# Patient Record
Sex: Male | Born: 1950 | Race: White | Hispanic: No | Marital: Married | Smoking: Former smoker
Health system: Southern US, Community
[De-identification: ages and names within clinical notes are randomized; demographics above are authoritative.]

## PROBLEM LIST (undated history)

## (undated) DIAGNOSIS — L0292 Furuncle, unspecified: Secondary | ICD-10-CM

## (undated) DIAGNOSIS — I77811 Abdominal aortic ectasia: Secondary | ICD-10-CM

## (undated) DIAGNOSIS — Z8719 Personal history of other diseases of the digestive system: Secondary | ICD-10-CM

## (undated) DIAGNOSIS — T4145XA Adverse effect of unspecified anesthetic, initial encounter: Secondary | ICD-10-CM

## (undated) DIAGNOSIS — Z9889 Other specified postprocedural states: Secondary | ICD-10-CM

## (undated) DIAGNOSIS — F419 Anxiety disorder, unspecified: Secondary | ICD-10-CM

## (undated) DIAGNOSIS — L0293 Carbuncle, unspecified: Secondary | ICD-10-CM

## (undated) DIAGNOSIS — K219 Gastro-esophageal reflux disease without esophagitis: Secondary | ICD-10-CM

## (undated) DIAGNOSIS — J40 Bronchitis, not specified as acute or chronic: Secondary | ICD-10-CM

## (undated) DIAGNOSIS — K589 Irritable bowel syndrome without diarrhea: Secondary | ICD-10-CM

## (undated) DIAGNOSIS — R112 Nausea with vomiting, unspecified: Secondary | ICD-10-CM

## (undated) DIAGNOSIS — T7840XA Allergy, unspecified, initial encounter: Secondary | ICD-10-CM

## (undated) DIAGNOSIS — E041 Nontoxic single thyroid nodule: Secondary | ICD-10-CM

## (undated) DIAGNOSIS — J45909 Unspecified asthma, uncomplicated: Secondary | ICD-10-CM

## (undated) DIAGNOSIS — T8859XA Other complications of anesthesia, initial encounter: Secondary | ICD-10-CM

## (undated) DIAGNOSIS — M1990 Unspecified osteoarthritis, unspecified site: Secondary | ICD-10-CM

## (undated) DIAGNOSIS — I10 Essential (primary) hypertension: Secondary | ICD-10-CM

## (undated) HISTORY — PX: OTHER SURGICAL HISTORY: SHX169

## (undated) HISTORY — DX: Gastro-esophageal reflux disease without esophagitis: K21.9

## (undated) HISTORY — DX: Furuncle, unspecified: L02.92

## (undated) HISTORY — DX: Abdominal aortic ectasia: I77.811

## (undated) HISTORY — DX: Personal history of other diseases of the digestive system: Z87.19

## (undated) HISTORY — DX: Personal history of other diseases of the digestive system: Z98.890

## (undated) HISTORY — DX: Bronchitis, not specified as acute or chronic: J40

## (undated) HISTORY — DX: Allergy, unspecified, initial encounter: T78.40XA

## (undated) HISTORY — DX: Unspecified asthma, uncomplicated: J45.909

## (undated) HISTORY — DX: Irritable bowel syndrome, unspecified: K58.9

## (undated) HISTORY — DX: Carbuncle, unspecified: L02.93

---

## 1994-04-17 HISTORY — PX: LAPAROSCOPIC CHOLECYSTECTOMY: SUR755

## 1998-12-17 ENCOUNTER — Encounter: Payer: Self-pay | Admitting: Emergency Medicine

## 1998-12-17 ENCOUNTER — Emergency Department (HOSPITAL_COMMUNITY): Admission: EM | Admit: 1998-12-17 | Discharge: 1998-12-17 | Payer: Self-pay | Admitting: Emergency Medicine

## 2005-10-05 ENCOUNTER — Ambulatory Visit: Payer: Self-pay | Admitting: Pulmonary Disease

## 2005-10-24 ENCOUNTER — Ambulatory Visit: Payer: Self-pay | Admitting: Pulmonary Disease

## 2005-12-16 HISTORY — PX: OTHER SURGICAL HISTORY: SHX169

## 2006-01-05 ENCOUNTER — Ambulatory Visit (HOSPITAL_BASED_OUTPATIENT_CLINIC_OR_DEPARTMENT_OTHER): Admission: RE | Admit: 2006-01-05 | Discharge: 2006-01-05 | Payer: Self-pay | Admitting: General Surgery

## 2007-11-20 ENCOUNTER — Encounter: Payer: Self-pay | Admitting: Pulmonary Disease

## 2007-12-25 ENCOUNTER — Encounter: Payer: Self-pay | Admitting: Pulmonary Disease

## 2009-01-14 ENCOUNTER — Telehealth: Payer: Self-pay | Admitting: Internal Medicine

## 2009-01-14 ENCOUNTER — Telehealth: Payer: Self-pay | Admitting: Pulmonary Disease

## 2009-02-22 ENCOUNTER — Telehealth (INDEPENDENT_AMBULATORY_CARE_PROVIDER_SITE_OTHER): Payer: Self-pay | Admitting: *Deleted

## 2009-02-24 ENCOUNTER — Ambulatory Visit: Payer: Self-pay | Admitting: Pulmonary Disease

## 2009-03-01 ENCOUNTER — Ambulatory Visit: Payer: Self-pay | Admitting: Pulmonary Disease

## 2009-03-01 DIAGNOSIS — K589 Irritable bowel syndrome without diarrhea: Secondary | ICD-10-CM

## 2009-03-01 DIAGNOSIS — K219 Gastro-esophageal reflux disease without esophagitis: Secondary | ICD-10-CM

## 2009-03-14 DIAGNOSIS — R0789 Other chest pain: Secondary | ICD-10-CM | POA: Insufficient documentation

## 2009-03-14 DIAGNOSIS — K4090 Unilateral inguinal hernia, without obstruction or gangrene, not specified as recurrent: Secondary | ICD-10-CM | POA: Insufficient documentation

## 2009-03-14 LAB — CONVERTED CEMR LAB
ALT: 16 units/L (ref 0–53)
AST: 21 units/L (ref 0–37)
Albumin: 4.4 g/dL (ref 3.5–5.2)
Alkaline Phosphatase: 57 units/L (ref 39–117)
BUN: 13 mg/dL (ref 6–23)
Basophils Absolute: 0 10*3/uL (ref 0.0–0.1)
Basophils Relative: 0.4 % (ref 0.0–3.0)
Bilirubin Urine: NEGATIVE
Bilirubin, Direct: 0.2 mg/dL (ref 0.0–0.3)
CO2: 30 meq/L (ref 19–32)
Calcium: 9.2 mg/dL (ref 8.4–10.5)
Chloride: 101 meq/L (ref 96–112)
Cholesterol: 157 mg/dL (ref 0–200)
Creatinine, Ser: 1.3 mg/dL (ref 0.4–1.5)
Eosinophils Absolute: 0.2 10*3/uL (ref 0.0–0.7)
Eosinophils Relative: 3.4 % (ref 0.0–5.0)
GFR calc non Af Amer: 60.56 mL/min (ref >60)
Glucose, Bld: 91 mg/dL (ref 70–99)
HCT: 48 % (ref 39.0–52.0)
HDL: 38.5 mg/dL — ABNORMAL LOW (ref >39.00)
Hemoglobin: 16.2 g/dL (ref 13.0–17.0)
Ketones, ur: NEGATIVE mg/dL
LDL Cholesterol: 92 mg/dL (ref 0–99)
Leukocytes, UA: NEGATIVE
Lymphocytes Relative: 19.8 % (ref 12.0–46.0)
Lymphs Abs: 1.3 10*3/uL (ref 0.7–4.0)
MCHC: 33.8 g/dL (ref 30.0–36.0)
MCV: 95.2 fL (ref 78.0–100.0)
Monocytes Absolute: 0.6 10*3/uL (ref 0.1–1.0)
Monocytes Relative: 9.6 % (ref 3.0–12.0)
Neutro Abs: 4.3 10*3/uL (ref 1.4–7.7)
Neutrophils Relative %: 66.8 % (ref 43.0–77.0)
Nitrite: NEGATIVE
PSA: 0.82 ng/mL (ref 0.10–4.00)
Platelets: 207 10*3/uL (ref 150.0–400.0)
Potassium: 4.1 meq/L (ref 3.5–5.1)
RBC: 5.04 M/uL (ref 4.22–5.81)
RDW: 12.5 % (ref 11.5–14.6)
Sodium: 139 meq/L (ref 135–145)
Specific Gravity, Urine: <=1.005 (ref 1.000–1.030)
TSH: 1.39 microintl units/mL (ref 0.35–5.50)
Total Bilirubin: 1.1 mg/dL (ref 0.3–1.2)
Total CHOL/HDL Ratio: 4
Total Protein, Urine: NEGATIVE mg/dL
Total Protein: 6.7 g/dL (ref 6.0–8.3)
Triglycerides: 132 mg/dL (ref 0.0–149.0)
Urine Glucose: NEGATIVE mg/dL
Urobilinogen, UA: 0.2 (ref 0.0–1.0)
VLDL: 26.4 mg/dL (ref 0.0–40.0)
WBC: 6.4 10*3/uL (ref 4.5–10.5)
pH: 5.5 (ref 5.0–8.0)

## 2009-10-29 ENCOUNTER — Ambulatory Visit: Payer: Self-pay | Admitting: Internal Medicine

## 2009-10-29 ENCOUNTER — Encounter: Payer: Self-pay | Admitting: Adult Health

## 2009-11-09 ENCOUNTER — Ambulatory Visit: Payer: Self-pay | Admitting: Pulmonary Disease

## 2010-01-12 ENCOUNTER — Telehealth (INDEPENDENT_AMBULATORY_CARE_PROVIDER_SITE_OTHER): Payer: Self-pay | Admitting: *Deleted

## 2010-03-30 ENCOUNTER — Ambulatory Visit: Payer: Self-pay | Admitting: Pulmonary Disease

## 2010-03-30 ENCOUNTER — Encounter: Payer: Self-pay | Admitting: Pulmonary Disease

## 2010-04-04 ENCOUNTER — Ambulatory Visit: Payer: Self-pay | Admitting: Pulmonary Disease

## 2010-04-13 LAB — CONVERTED CEMR LAB
ALT: 17 units/L (ref 0–53)
AST: 19 units/L (ref 0–37)
Albumin: 4.3 g/dL (ref 3.5–5.2)
Alkaline Phosphatase: 59 units/L (ref 39–117)
BUN: 19 mg/dL (ref 6–23)
Basophils Absolute: 0.1 10*3/uL (ref 0.0–0.1)
Basophils Relative: 0.8 % (ref 0.0–3.0)
Bilirubin, Direct: 0.1 mg/dL (ref 0.0–0.3)
CO2: 30 meq/L (ref 19–32)
Calcium: 9.2 mg/dL (ref 8.4–10.5)
Chloride: 102 meq/L (ref 96–112)
Cholesterol: 180 mg/dL (ref 0–200)
Creatinine, Ser: 1.4 mg/dL (ref 0.4–1.5)
Eosinophils Absolute: 0.6 10*3/uL (ref 0.0–0.7)
Eosinophils Relative: 8 % — ABNORMAL HIGH (ref 0.0–5.0)
GFR calc non Af Amer: 57.75 mL/min — ABNORMAL LOW (ref >60.00)
Glucose, Bld: 72 mg/dL (ref 70–99)
HCT: 47.3 % (ref 39.0–52.0)
HDL: 42.5 mg/dL (ref >39.00)
Hemoglobin: 16.4 g/dL (ref 13.0–17.0)
LDL Cholesterol: 122 mg/dL — ABNORMAL HIGH (ref 0–99)
Lymphocytes Relative: 26.4 % (ref 12.0–46.0)
Lymphs Abs: 1.9 10*3/uL (ref 0.7–4.0)
MCHC: 34.7 g/dL (ref 30.0–36.0)
MCV: 91 fL (ref 78.0–100.0)
Monocytes Absolute: 0.6 10*3/uL (ref 0.1–1.0)
Monocytes Relative: 8.7 % (ref 3.0–12.0)
Neutro Abs: 4 10*3/uL (ref 1.4–7.7)
Neutrophils Relative %: 56.1 % (ref 43.0–77.0)
PSA: 1.41 ng/mL (ref 0.10–4.00)
Platelets: 236 10*3/uL (ref 150.0–400.0)
Potassium: 4.1 meq/L (ref 3.5–5.1)
RBC: 5.19 M/uL (ref 4.22–5.81)
RDW: 13 % (ref 11.5–14.6)
Sodium: 141 meq/L (ref 135–145)
TSH: 1.46 microintl units/mL (ref 0.35–5.50)
Total Bilirubin: 1.2 mg/dL (ref 0.3–1.2)
Total CHOL/HDL Ratio: 4
Total Protein: 6.8 g/dL (ref 6.0–8.3)
Triglycerides: 78 mg/dL (ref 0.0–149.0)
VLDL: 15.6 mg/dL (ref 0.0–40.0)
WBC: 7.2 10*3/uL (ref 4.5–10.5)

## 2010-05-08 ENCOUNTER — Encounter: Payer: Self-pay | Admitting: Neurosurgery

## 2010-05-19 NOTE — Assessment & Plan Note (Signed)
Summary: physical ///kp   CC:  Yearly ROV & CPX....  History of Present Illness: 60 y/o WM here for a follow up visit and CPX...  he saw TP 7/11 w/ "boils" and cultures grew MSSA- he was treated topically & w/ Keflex, then Avelox, & it resolved w/o recurrence so far... recently he's noted some incr SOB- w/ cold ait & paint fumes; treated self w/ OTC Primateen like inhaler & improved... recall hx AB in past, ex-smoker quit >45yrs, & we discussed RADS etc... see CXR, PFTs below & we decided to treat w/ PROAIT HFA Prn for now & monitor him...    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 presented 12/11 w/ several week hx incr SOB, min cough, no sputum, denies f/c/s, no sinus or upper resp symptoms... he treated self w/ OTC Primateen like med & improved... we decided to Rx w/ PROAIR Prn for now...  ~  CXR 12/11 showed   ~  PFT 12/11 showed FVC= 4.4 (84%), FEV1= 3.4 (85%), %1sec= 77, mid-flows= 91%pred, O2sat=99%.   Hx of CHEST PAIN, ATYPICAL (ICD-786.59) - on ASA 81mg /d... he had Cardiolite 2000 which was neg...  ~  EKG 12/11 showed NSR, WNL, NAD... denies CP, angina, palpit, etc...  GERD (ICD-530.81) - on PRILOSEC 20mg /d... notes some heartburn, no dysphagia etc...  IRRITABLE BOWEL SYNDROME (ICD-564.1) - occas abd discomfort & alt diarrhea/ constip... had colonoscopy 2005 which was neg x hemorrhoids... f/u planned 97yrs.  Hx of INGUINAL HERNIA (ICD-550.90) - he had left inguinal hernia repaired at age 61, and a right inguinal hernia repair w/ mesh in 2007... he had post-op pain, neuritis that improved w/ Lyrica, now resolved.  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 advised nasal saline spray.   Preventive Screening-Counseling & Management  Alcohol-Tobacco     Smoking Status: quit     Year Quit: 1985  Allergies (verified): No Known  Drug Allergies  Comments:  Nurse/Medical Assistant: The patient's medications and allergies were reviewed with the patient and were updated in the Medication and Allergy Lists.  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 mother deceased age 1 from breast cancer father alive age 64  hx of thyroid cancer 1 sibling alive age 30 1 sibling alive age 28  Social History: Reviewed history from 03/01/2009 and no changes required. divorced 3 children quit smoking 1985 exposed to second hand smoke exercises 3 times per week caffeine use--6 cups per day alcohol use 1-2 daily  Review of Systems  The patient denies fever, chills, sweats, anorexia, fatigue, weakness, malaise, weight loss, sleep disorder, blurring, diplopia, eye irritation, eye discharge, vision loss, eye pain, photophobia, earache, ear discharge, tinnitus, decreased hearing, nasal congestion, nosebleeds, sore throat, hoarseness, chest pain, palpitations, syncope, dyspnea on exertion, orthopnea, PND, peripheral edema, cough, dyspnea at rest, excessive sputum, hemoptysis, wheezing, pleurisy, nausea, vomiting, diarrhea, constipation, change in bowel habits, abdominal pain, melena, hematochezia, jaundice, gas/bloating, indigestion/heartburn, dysphagia, odynophagia, dysuria, hematuria, urinary frequency, urinary hesitancy, nocturia, incontinence, back pain,  joint pain, joint swelling, muscle cramps, muscle weakness, stiffness, arthritis, sciatica, restless legs, leg pain at night, leg pain with exertion, rash,  itching, dryness, suspicious lesions, paralysis, paresthesias, seizures, tremors, vertigo, transient blindness, frequent falls, frequent headaches, difficulty walking, depression, anxiety, memory loss, confusion, cold intolerance, heat intolerance, polydipsia, polyphagia, polyuria, unusual weight change, abnormal bruising, bleeding, enlarged lymph nodes, urticaria, allergic rash, hay fever, and recurrent infections.    Vital Signs:  Patient profile:   60 year old male Height:      72 inches Weight:      183.13 pounds BMI:     24.93 O2 Sat:      100 % on Room air Temp:     97.2 degrees F oral Pulse rate:   64 / minute BP sitting:   148 / 88  (left arm) Cuff size:   regular  Vitals Entered By: Randell Loop CMA (March 30, 2010 12:01 PM)  O2 Sat at Rest %:  100 O2 Flow:  Room air CC: Yearly ROV & CPX... Is Patient Diabetic? No Pain Assessment Patient in pain? no      Comments MEDS UPDATED TODAY WITH PT   Physical Exam  Additional Exam:  WD, WN, 60 y/o WM in NAD... GENERAL:  Alert & oriented; pleasant & cooperative. HEENT:  [place]/AT, EOM-wnl, PERRLA, Fundi-benign, EACs-clear, TMs-wnl, NOSE-clear, THROAT-clear & wnl. NECK:  Supple w/ full ROM; no JVD; normal carotid impulses w/o bruits; no thyromegaly or nodules palpated; no lymphadenopathy. CHEST:  Clear to P & A; without wheezes/ rales/ or rhonchi. HEART:  Regular Rhythm; without murmurs/ rubs/ or gallops. ABDOMEN:  Soft & nontender; normal bowel sounds; no organomegaly or masses detected. RECTAL:  Neg - prostate 2+ & nontender w/o nodules; stool hematest neg. EXT: without deformities or arthritic changes; no varicose veins/ venous insuffic/ or edema. NEURO:  CN's intact; motor testing normal; sensory testing normal; gait normal & balance OK. DERM:  No lesions noted; no rash etc...    CXR  Procedure date:  03/30/2010  Findings:      CHEST - 2 VIEW Comparison: 03/01/2009   Findings: Heart and mediastinal contours are  within normal limits. No focal opacities or effusions.  No acute bony abnormality.   IMPRESSION: No active disease.   MISC. Report  Procedure date:  03/30/2010  Findings:      Pt will return for FASTING labs & we will call him w/ results... SN   Pulmonary Function Test Date: 03/30/2010 Height (in.): 72 Gender: Male  Pre-Spirometry FVC    Value: 4.4 L/min   Pred: 5.2 L/min     % Pred: 84 % FEV1    Value: 3.4 L     Pred: 4.0 L     % Pred: 85 % FEV1/FVC  Value: 77 %     Pred: 76 %     % Pred: -- % FEF 25-75  Value: 3.0 L/min   Pred: 3.3 L/min     % Pred: 91 %  Comments: Normalo airflow, volumes at lower end of normal... 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 we discussed checking PFTs> they look good, no signif obstructive dis, & we decided to Rx w/ PROAIR Prn... also discussed need for controller drug IF he's having to use the Proair regularly... Orders: Spirometry w/Graph (94010)  Problem # 3:  Hx of CHEST PAIN, ATYPICAL (ICD-786.59) No CP, palpit, etc...  Problem # 4:  GERD (ICD-530.81) Stable on the PPI Rx Prn... notes occais reflux, otherw neg... His updated medication list for this problem includes:    Prilosec Otc 20 Mg Tbec (Omeprazole magnesium) .Marland Kitchen... Take 1 tablet by mouth once a day  Problem # 5:  IRRITABLE BOWEL SYNDROME (ICD-564.1) He isn't due for f/u colonoscopy til 2015...  Problem # 6:  BOILS,  RECURRENT (ICD-680.9) No recurrence so far> asked to use saline on nares, & call for any problems...  Complete Medication List: 1)  Aspirin 81 Mg Tabs (Aspirin) .... Take 1 tablet by mouth once a day 2)  Proair Hfa 108 (90 Base) Mcg/act Aers (Albuterol sulfate) .... 2 sprays up to every 4 h as needed for wheezing... 3)  Prilosec Otc 20 Mg Tbec (Omeprazole magnesium) .... Take 1 tablet by mouth once a day  Patient Instructions: 1)  Today we updated your med list- see below.... 2)  We wrote a new perscription for PROAIR (a rescue inhaler) to use as we discussed... 3)  Call for any persistant problem w/ your breathing... 4)  Today we did a follow up CXR, EKG, & FASTING blood work... please call the "phone tree" in a few days for your lab results.Marland KitchenMarland Kitchen  5)  Keep up the good work w/ diet + exercise... 6)  Please schedule a follow-up appointment in 1 year, sooner as needed. Prescriptions: PROAIR HFA 108 (90 BASE) MCG/ACT AERS (ALBUTEROL SULFATE) 2 sprays up to every 4 H as needed for wheezing...  #1 x prn   Entered and Authorized by:   Michele Mcalpine MD   Signed by:   Michele Mcalpine MD on 03/30/2010   Method used:   Print then Give to Patient   RxID:   [id number]    Immunization History:  Influenza Immunization History:    Influenza:  historical (01/24/2010)

## 2010-05-19 NOTE — Assessment & Plan Note (Signed)
Summary: NP follow up - boil, left arm   CC:  1 week follow boil on left forearm - has improved since last OV.  History of Present Illness: 60 y/o WM  with known hx of GERD   October 29, 2009--Presents for recurrent boils x77months, states that the boil on the left forearm is about the 6th one he's had - red, raised, painful with occasional drainage x5days. Has noticed over last 6 months recurrent boils under arms on chest and arms. No new meds, travel , actiivties. Denies chest pain, dyspnea, orthopnea, hemoptysis, fever, n/v/d, edema, headache.   11/09/09--Returns for 1 week follow boil on left forearm - has improved since last OV. Wound cx showed staph aureus which was pan sensitive except for PCN. He was tx w/ Avelox. Boil has decreased in size, almost gone. Redness is decreased. Denies chest pain, dyspnea, orthopnea, hemoptysis, fever, n/v/d, edema, headache.   Medications Prior to Update: 1)  Aspirin 81 Mg Tabs (Aspirin) .... Take 1 Tablet By Mouth Once A Day 2)  Prilosec Otc 20 Mg Tbec (Omeprazole Magnesium) .... Take 1 Tablet By Mouth Once A Day 3)  Avelox 400 Mg Tabs (Moxifloxacin Hcl) .... Take 1 Tablet By Mouth Once A Day X7days  Current Medications (verified): 1)  Aspirin 81 Mg Tabs (Aspirin) .... Take 1 Tablet By Mouth Once A Day 2)  Prilosec Otc 20 Mg Tbec (Omeprazole Magnesium) .... 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 no recent bronchitic problems...  Hx of CHEST PAIN, ATYPICAL (ICD-786.59) - on ASA 81mg /d... he had Cardiolite 2000 which was neg...  GERD (ICD-530.81) - on PRILOSEC 20mg /d...  IRRITABLE BOWEL SYNDROME (ICD-564.1) - occas abd discomfort & alt diarrhea/ constip... had colonoscopy 2005 which was neg x hemorrhoids... f/u planned 24yrs.  Hx of INGUINAL HERNIA (ICD-550.90) -  he had left inguinal hernia repaired at age 23, and a right inguinal hernia repair w/ mesh in 1996... he had post-op pain, neuritis that improved w/ Lyrica...  Past Surgical History: Last updated: 2009-03-19 S/P left inguinal hernia repair at age 75 S/P lap cholecystectomy 1996 S/P right inguinal hernia repair w/ mesh 9/07 DrThompson  Family History: Last updated: 19-Mar-2009 mother deceased age 46 from breast cancer father alive age 70  hx of thyroid cancer 1 sibling alive age 48 1 sibling alive age 23  Social History: Last updated: 2009-03-19 divorced 3 children quit smoking 1985 exposed to second hand smoke exercises 3 times per week caffeine use--6 cups per day alcohol use 1-2 daily  Risk Factors: Smoking Status: quit (10/29/2009)  Review of Systems      See HPI  Vital Signs:  Patient profile:   60 year old male Height:      72 inches Weight:      186 pounds BMI:     25.32 O2 Sat:      99 % on Room air Temp:     98.0 degrees F oral Pulse rate:   66 / minute BP sitting:   132 / 84  (right arm) Cuff size:   regular  Vitals Entered By: Boone Master CNA/MA (November 09, 2009 4:09 PM)  O2 Flow:  Room air CC: 1 week follow boil on left forearm - has improved since last OV Is Patient Diabetic? No Comments Medications reviewed with patient Daytime contact number  verified with patient. Boone Master CNA/MA  November 09, 2009 4:10 PM    Physical Exam  Additional Exam:  WD, WN, 60 y/o WM in NAD... GENERAL:  Alert & oriented; pleasant & cooperative.  DERM:  left mid forearm decreased size of carbuncle-decresed redness. much improved.     Impression & Recommendations:  Problem # 1:  BOILS, RECURRENT (ICD-680.9)  Improved.  if returns may need derm referral REC:  Clean area two times a day w/ soap and water, pat dry.  Warm compresses three times a day until resolved.  Please contact office for sooner follow up if symptoms do not improve or worsen   Orders: Est.  Patient Level II (14782)  Complete Medication List: 1)  Aspirin 81 Mg Tabs (Aspirin) .... Take 1 tablet by mouth once a day 2)  Prilosec Otc 20 Mg Tbec (Omeprazole magnesium) .... Take 1 tablet by mouth once a day  Patient Instructions: 1)  Clean area two times a day w/ soap and water, pat dry.  2)  Warm compresses three times a day until resolved.  3)  Please contact office for sooner follow up if symptoms do not improve or worsen

## 2010-05-19 NOTE — Progress Notes (Signed)
Summary: possible pink eye  Phone Note Call from Patient Call back at Home Phone [phone]   Caller: Patient Call For: nadel Summary of Call: Pt stated he saw TP in July for staph infection, he suspects he has a reoccurence, pls advise.//[facility] Kathryne Sharper Initial call taken by: Darletta Moll,  January 12, 2010 1:02 PM  Follow-up for Phone Call        called spoke with patient who states the previous boil on the left arm resolved.  states has possible pink eye in right eye that is red, itchy and has drainage x3days.  has improved, but not completely resolved.  d/t the high contagion of pink eye, does pt need ov?  please advise, thanks!  Merilynn [place].  NKDA.  Follow-up by: Boone Master CNA/MA,  January 12, 2010 3:15 PM  Additional Follow-up for Phone Call Additional follow up Details #1::        per SN----neosporin  optic  #1 vial  1-2 gtts in affected eye four times daily as directed.  with no refills---this has already been sent to the pharmacy thanks  Randell Loop [facility]  January 13, 2010 8:55 AM     Additional Follow-up for Phone Call Additional follow up Details #2::    Spoke with pt and notified med was sent to pharmacy.  Pt verbalized understanding. Follow-up by: Vernie Murders,  January 13, 2010 9:07 AM  New/Updated Medications: NEOSPORIN  SOLN (NEOMYCIN-POLYMYX-GRAMICID) 1-2 gtts in affected eye four times daily as directed Prescriptions: NEOSPORIN  SOLN (NEOMYCIN-POLYMYX-GRAMICID) 1-2 gtts in affected eye four times daily as directed  #1 vial x 0   Entered by:   Randell Loop CMA   Authorized by:   Michele Mcalpine MD   Signed by:   Vernie Murders on 01/13/2010   Method used:   Electronically to        [facility] (retail)       [address] [place], [place]  [address]       Ph: [phone]       Fax: [phone]   RxID:   [id number]

## 2010-05-19 NOTE — Assessment & Plan Note (Signed)
Summary: Acute NP office visit - boil left forearm   CC:  recurrent boils x53months, states that the boil on the left forearm is about the 6th one he's had - red, raised, and painful with occasional drainage x5days.  History of Present Illness: 60 y/o WM  with known hx of GERD   October 29, 2009--Presents for recurrent boils x79months, states that the boil on the left forearm is about the 6th one he's had - red, raised, painful with occasional drainage x5days. Has noticed over last 6 months recurrent boils under arms on chest and arms. No new meds, travel , actiivties. Denies chest pain, dyspnea, orthopnea, hemoptysis, fever, n/v/d, edema, headache.   Preventive Screening-Counseling & Management  Alcohol-Tobacco     Smoking Status: quit  Medications Prior to Update: 1)  Aspirin 81 Mg Tabs (Aspirin) .... Take 1 Tablet By Mouth Once A Day 2)  Prilosec Otc 20 Mg Tbec (Omeprazole Magnesium) .... Take 1 Tablet By Mouth Once A Day  Current Medications (verified): 1)  Aspirin 81 Mg Tabs (Aspirin) .... Take 1 Tablet By Mouth Once A Day 2)  Prilosec Otc 20 Mg Tbec (Omeprazole Magnesium) .... Take 1 Tablet By Mouth Once A Day  Allergies (verified): No Known Drug Allergies  Past History:  Past Surgical History: Last updated: March 03, 2009 S/P left inguinal hernia repair at age 83 S/P lap cholecystectomy 1996 S/P right inguinal hernia repair w/ mesh 9/07 DrThompson  Family History: Last updated: Mar 03, 2009 mother deceased age 31 from breast cancer father alive age 73  hx of thyroid cancer 1 sibling alive age 10 1 sibling alive age 41  Social History: Last updated: 03/03/09 divorced 3 children quit smoking 1985 exposed to second hand smoke exercises 3 times per week caffeine use--6 cups per day alcohol use 1-2 daily  Risk Factors: Smoking Status: quit (10/29/2009)  Past Medical History: Hx of BRONCHITIS, RECURRENT (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 no recent  bronchitic problems...  Hx of CHEST PAIN, ATYPICAL (ICD-786.59) - on ASA 81mg /d... he had Cardiolite 2000 which was neg...  GERD (ICD-530.81) - on PRILOSEC 20mg /d...  IRRITABLE BOWEL SYNDROME (ICD-564.1) - occas abd discomfort & alt diarrhea/ constip... had colonoscopy 2005 which was neg x hemorrhoids... f/u planned 75yrs.  Hx of INGUINAL HERNIA (ICD-550.90) - he had left inguinal hernia repaired at age 78, and a right inguinal hernia repair w/ mesh in 1996... he had post-op pain, neuritis that improved w/ Lyrica...  Social History: Smoking Status:  quit  Review of Systems      See HPI  Vital Signs:  Patient profile:   60 year old male Height:      72 inches Weight:      183.50 pounds BMI:     24.98 O2 Sat:      99 % on Room air Temp:     9.5 degrees F oral Pulse rate:   60 / minute BP sitting:   116 / 74  (right arm) Cuff size:   regular  Vitals Entered By: Boone Master CNA/MA (October 29, 2009 10:27 AM)  O2 Flow:  Room air CC: recurrent boils x1months, states that the boil on the left forearm is about the 6th one he's had - red, raised, painful with occasional drainage x5days Is Patient Diabetic? No Comments Medications reviewed with patient Daytime contact number verified with patient. Boone Master CNA/MA  October 29, 2009 10:27 AM    Physical Exam  Additional Exam:  WD, WN, 60 y/o WM  in NAD... GENERAL:  Alert & oriented; pleasant & cooperative. HEENT:  [place]/AT, EOM-wnl, PERRLA, Fundi-benign, EACs-clear, TMs-wnl, NOSE-clear, THROAT-clear & wnl. NECK:  Supple w/ full ROM; no JVD; normal carotid impulses w/o bruits; no thyromegaly or nodules palpated; no lymphadenopathy. CHEST:  Clear to P & A; without wheezes/ rales/ or rhonchi. HEART:  Regular Rhythm; without murmurs/ rubs/ or gallops. ABDOMEN:  Soft & nontender; normal bowel sounds; no organomegaly or masses detected. RECTAL:  Neg - prostate 2+ & nontender w/o nodules; stool hematest neg. EXT: without deformities or  arthritic changes; no varicose veins/ venous insuffic/ or edema. NEURO:  CN's intact; motor testing normal; sensory testing normal; gait normal & balance OK. 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  REC:  Keflex 500mg  four times a day for 7 days Clean area two times a day w/ soap and water, pat dry.  Warm compresses three times a day as needed  I will call with labs  Please contact office for sooner follow up if symptoms do not improve or worsen   Orders: T-Culture, Wound ([id number]) Est. Patient Level III (78295)  Medications Added to Medication List This Visit: 1)  Keflex 500 Mg Caps (Cephalexin) .Marland Kitchen.. 1 by mouth four times a day  Complete Medication List: 1)  Aspirin 81 Mg Tabs (Aspirin) .... Take 1 tablet by mouth once a day 2)  Prilosec Otc 20 Mg Tbec (Omeprazole magnesium) .... Take 1 tablet by mouth once a day 3)  Keflex 500 Mg Caps (Cephalexin) .Marland Kitchen.. 1 by mouth four times a day  Patient Instructions: 1)  Keflex 500mg  four times a day for 7 days 2)  Clean area two times a day w/ soap and water, pat dry.  3)  Warm compresses three times a day as needed  4)  I will call with labs  5)  Please contact office for sooner follow up if symptoms do not improve or worsen  Prescriptions: KEFLEX 500 MG CAPS (CEPHALEXIN) 1 by mouth four times a day  #28 x 0   Entered and Authorized by:   Rubye Oaks NP   Signed by:   Rubye Oaks NP on 10/29/2009   Method used:   Electronically to        [facility] ([facility])       [address] [place], [place]  [address]       Ph: [phone]       Fax: [phone]   RxID:   [id number]

## 2010-09-02 NOTE — Op Note (Signed)
Trevor Watson, Trevor Watson           ACCOUNT NO.:  [id number]   MEDICAL RECORD NO.:  [id number]          PATIENT TYPE:  AMB   LOCATION:  DSC                          FACILITY:  [facility]   PHYSICIAN:  Gabrielle Dare. Janee Morn, M.D.DATE OF BIRTH:  1951/03/28   DATE OF PROCEDURE:  01/05/2006  DATE OF DISCHARGE:                                 OPERATIVE REPORT   PREOPERATIVE DIAGNOSIS:  Right inguinal hernia.   POSTOPERATIVE DIAGNOSES:  1. Right inguinal hernia.  2. Cord lipoma.   PROCEDURE:  Repair of right inguinal hernia with mesh.   SURGEON:  Gabrielle Dare. Janee Morn, M.D.   ANESTHESIA:  General.   HISTORY OF PRESENT ILLNESS:  Mr. Gerry is a 60 year old gentleman whom I  evaluated the office for symptomatic right inguinal hernia.  He presents  today for an elective repair.   PROCEDURE IN DETAIL:  Informed consent was obtained.  The patient received  intravenous antibiotics.  His site was marked.  He was brought to the  operating room and general anesthesia was administered.  His right groin and  abdomen were prepped and draped in a sterile fashion.  Marcaine 0.25% with  epinephrine was injected along the planned incision site and out towards the  anterior-superior iliac spine to provide some local anesthetic and nerve  block.  Right inguinal incision was made.  Subcutaneous tissues were  dissected down through Scarpa fascia, revealing the external oblique fascia.  This was divided sharply down through the external ring.  The superior  leaflet of the external oblique was freed up off the underlying  transversalis and the inferior leaflet was dissected down, revealing the  shelving edge of the inguinal ligament.  The cord structures were encircled  with a Penrose drain.  The cord was then inspected and he was noted to have  an indirect hernia sac as well as a large cord lipoma.  The cord lipoma was  separated from the cord structures and the hernia sac.  Hernia sac was then  further  separated away from the cord structures with care not to damage  them.  The contents of the hernia sac had spontaneously reduced.  The hernia  sac was twisted and high ligated with a 2-0 Vicryl suture ligature and  excised.  Subsequently, the cord lipoma was ligated at its base with 2-0  Vicryl and excised.  Cord structures remained intact.  The inguinal floor  appeared a little bit lax but there is no obvious direct hernia.  We then  completed the repair with a keyhole-fashioned polypropylene mesh.  This was  secured to the tissues over the pubic tubercle with 0 Prolene suture and it  was sewn in a running fashion inferiorly along the shelving edge of the  inguinal ligament out laterally.  Subsequently the mesh was tacked medially  and superiorly with interrupted 0 Prolene sutures along the tissue overlying  the pubic tubercle and then along the transversalis.  The two leaflets were  rejoined behind the cord, reconstructing the ring, and these were tacked  together with 0 Prolene and securely ligated to the underlying musculature.  This was done with  several interrupted 0 Prolene sutures.  The aperture in  the mesh admitted the tip of the fifth digit next to the cord.  The cord  structures remained viable and not edematous, and the area was copiously  irrigated and meticulous hemostasis was ensured.  At this time some  additional local anesthetic was injected.  The external oblique fascia was  closed with a running 2-0 Vicryl suture.  Please note that the ilioinguinal  nerve, which had been protected throughout the case, was going to be tightly  approximated with a portion of the mesh and due to the risk of postoperative  neuropathic pain, this nerve was divided.  The remainder of the external  oblique fascia was closed.  Subcutaneous tissues were irrigated.  Scarpa  fascia was reapproximated with interrupted 3-0 Vicryl suture and after  ensuring hemostasis, the skin was closed with a  running 4-0 Monocryl  subcuticular stitch.  Sponge, needle and instrument counts were correct.  Benzoin, Steri-Strips and sterile dressings were applied.  The patient's  right testicle was relocated to anatomic position in his scrotum and he was  taken to the recovery room in stable condition after tolerating the  procedure without apparent complication.      Gabrielle Dare Janee Morn, M.D.  Electronically Signed     BET/MEDQ  D:  01/05/2006  T:  01/07/2006  Job:  [id number]   cc:   Lonzo Cloud. Kriste Basque, MD

## 2011-03-29 ENCOUNTER — Encounter: Payer: Self-pay | Admitting: Pulmonary Disease

## 2011-03-29 ENCOUNTER — Telehealth: Payer: Self-pay | Admitting: Pulmonary Disease

## 2011-03-29 NOTE — Telephone Encounter (Signed)
Yes he can come fasting in the morning to this appt and we will send him down for fasting labs after his appt with SN. Called and spoke with pt and he is aware of this and will be fasting in the morning for his appt.

## 2011-03-30 ENCOUNTER — Other Ambulatory Visit (INDEPENDENT_AMBULATORY_CARE_PROVIDER_SITE_OTHER): Payer: PRIVATE HEALTH INSURANCE

## 2011-03-30 ENCOUNTER — Ambulatory Visit (INDEPENDENT_AMBULATORY_CARE_PROVIDER_SITE_OTHER)
Admission: RE | Admit: 2011-03-30 | Discharge: 2011-03-30 | Disposition: A | Discharge status: Home / Self Care | Payer: PRIVATE HEALTH INSURANCE | Source: Ambulatory Visit | Attending: Pulmonary Disease | Admitting: Pulmonary Disease

## 2011-03-30 ENCOUNTER — Ambulatory Visit (INDEPENDENT_AMBULATORY_CARE_PROVIDER_SITE_OTHER): Payer: PRIVATE HEALTH INSURANCE | Admitting: Pulmonary Disease

## 2011-03-30 ENCOUNTER — Encounter: Payer: Self-pay | Admitting: Pulmonary Disease

## 2011-03-30 VITALS — BP 150/90 | HR 77 | Temp 98.0°F | Ht 72.0 in | Wt 194.2 lb

## 2011-03-30 DIAGNOSIS — R0609 Other forms of dyspnea: Secondary | ICD-10-CM

## 2011-03-30 DIAGNOSIS — K589 Irritable bowel syndrome without diarrhea: Secondary | ICD-10-CM

## 2011-03-30 DIAGNOSIS — Z23 Encounter for immunization: Secondary | ICD-10-CM

## 2011-03-30 DIAGNOSIS — K219 Gastro-esophageal reflux disease without esophagitis: Secondary | ICD-10-CM

## 2011-03-30 DIAGNOSIS — Z0000 Encounter for general adult medical examination without abnormal findings: Secondary | ICD-10-CM

## 2011-03-30 DIAGNOSIS — J45909 Unspecified asthma, uncomplicated: Secondary | ICD-10-CM | POA: Insufficient documentation

## 2011-03-30 DIAGNOSIS — K4090 Unilateral inguinal hernia, without obstruction or gangrene, not specified as recurrent: Secondary | ICD-10-CM

## 2011-03-30 DIAGNOSIS — R0683 Snoring: Secondary | ICD-10-CM | POA: Insufficient documentation

## 2011-03-30 LAB — URINALYSIS
Nitrite: NEGATIVE
Specific Gravity, Urine: >=1.03 (ref 1.000–1.030)
Total Protein, Urine: NEGATIVE
Urine Glucose: NEGATIVE
Urobilinogen, UA: 0.2 (ref 0.0–1.0)

## 2011-03-30 LAB — LIPID PANEL
Cholesterol: 182 mg/dL (ref 0–200)
LDL Cholesterol: 115 mg/dL — ABNORMAL HIGH (ref 0–99)
Triglycerides: 102 mg/dL (ref 0.0–149.0)

## 2011-03-30 LAB — HEPATIC FUNCTION PANEL
ALT: 23 U/L (ref 0–53)
Albumin: 4.6 g/dL (ref 3.5–5.2)
Total Protein: 7.2 g/dL (ref 6.0–8.3)

## 2011-03-30 LAB — BASIC METABOLIC PANEL
BUN: 20 mg/dL (ref 6–23)
CO2: 27 mEq/L (ref 19–32)
Chloride: 106 mEq/L (ref 96–112)
Potassium: 4.8 mEq/L (ref 3.5–5.1)

## 2011-03-30 LAB — CBC WITH DIFFERENTIAL/PLATELET
Basophils Relative: 0.6 % (ref 0.0–3.0)
Eosinophils Relative: 9.6 % — ABNORMAL HIGH (ref 0.0–5.0)
Lymphocytes Relative: 26.4 % (ref 12.0–46.0)
Neutrophils Relative %: 54.6 % (ref 43.0–77.0)
RBC: 5.21 Mil/uL (ref 4.22–5.81)
WBC: 6 10*3/uL (ref 4.5–10.5)

## 2011-03-30 LAB — PSA: PSA: 1.07 ng/mL (ref 0.10–4.00)

## 2011-03-30 MED ORDER — PREGABALIN 75 MG PO CAPS: 75.0000 mg | ORAL_CAPSULE | Freq: Two times a day (BID) | ORAL | Status: DC

## 2011-03-30 MED ORDER — OMEPRAZOLE MAGNESIUM 20 MG PO TBEC: 20.0000 mg | DELAYED_RELEASE_TABLET | Freq: Every day | ORAL | Status: DC

## 2011-03-30 MED ORDER — BUDESONIDE-FORMOTEROL FUMARATE 160-4.5 MCG/ACT IN AERO: 2.0000 | INHALATION_SPRAY | Freq: Two times a day (BID) | RESPIRATORY_TRACT | Status: DC

## 2011-03-30 MED ORDER — ALBUTEROL SULFATE HFA 108 (90 BASE) MCG/ACT IN AERS: 2.0000 | INHALATION_SPRAY | RESPIRATORY_TRACT | Status: DC | PRN

## 2011-03-30 NOTE — Patient Instructions (Signed)
Today we updated your med list in our EPIC system...    Continue your current medications the same...  For your BP:    We decided to "hold-off" on medication for now, but that means you must eliminate salt/ sodium from your diet & work on weight reduction...    Let's plan a follow up visit in 2-3 months to recheck your BP & monitor your progress...  For your Asthma:    We decided to add a controller med to be taken regularly: Symbicort- 2 sprays twice daily...    Continue to use your PROAIR as a rescue inhaler...    We are checking an allergy panel & will let you know the results...  For your Snoring:    We need a sleep study to quantitate the problem> we will sched a sleep study for you & call when the results are avail...    Try sleeping on your side etc as we discussed...  For the right inguinal discomfort:    We discussed restarting the LYRICA 75mg /d & you may increase to twice daily if necessary...  We gave you the 2012 Flu shot today.Marland KitchenMarland Kitchen

## 2011-03-31 LAB — ALLERGY FULL PROFILE
Allergen, D pternoyssinus,d7: 0.52 kU/L — ABNORMAL HIGH (ref <0.35)
Bahia Grass: <0.1 kU/L (ref <0.35)
Box Elder IgE: <0.1 kU/L (ref <0.35)
Cat Dander: 1.38 kU/L — ABNORMAL HIGH (ref <0.35)
Common Ragweed: 0.19 kU/L (ref <0.35)
Dog Dander: 10.3 kU/L — ABNORMAL HIGH (ref <0.35)
Elm IgE: <0.1 kU/L (ref <0.35)
G005 Rye, Perennial: <0.1 kU/L (ref <0.35)
G009 Red Top: <0.1 kU/L (ref <0.35)
Goldenrod: <0.1 kU/L (ref <0.35)
Helminthosporium halodes: <0.1 kU/L (ref <0.35)
House Dust Hollister: 3.33 kU/L — ABNORMAL HIGH (ref <0.35)
Sycamore Tree: <0.1 kU/L (ref <0.35)

## 2011-04-16 ENCOUNTER — Encounter: Payer: Self-pay | Admitting: Pulmonary Disease

## 2011-04-16 NOTE — Progress Notes (Signed)
Subjective:     Patient ID: Trevor Watson, male   DOB: 20-Dec-1950, 60 y.o.   MRN: [id number]  HPI 60 y/o WM here for a follow up visit and CPX...    ~  March 30, 2010:  he saw TP 7/11 w/ "boils" and cultures grew MSSA- he was treated topically & w/ Keflex, then Avelox, & it resolved w/o recurrence so far... recently he's noted some incr SOB- w/ cold ait & paint fumes; treated self w/ OTC Primateen like inhaler & improved... recall hx AB in past, ex-smoker quit >47yrs, & we discussed RADS etc... see CXR, PFTs below & we decided to treat w/ PROAIT HFA Prn for now & monitor him...  ~  March 30, 2011:  Yearly ROV & CPX> he's had a pretty good year he says but he's noted incr prob w/ his breathing & allergy symptoms ever since his girlfriend moved in w/ her dog, SOB/ wheezing/ cough, & eyes watering, etc; certainly sounds allergic & we discussed checking IgE & RAST tests; in the meanwhile try to avoid contact, keep dog groomed, freq cleaning & vacuuming, keep dog out of bedroom & use hi quality air purifier in there, take antihist daily...  He also says that girlfriend notes that he snores & stops breathing> c/w OSA & we discussed the need for Sleep Study... Finally he notes recurrent right inguinal pain s/p RIH repair w/ mesh in 2007; prev had to take Lyrica which helped & he weaned off- now recurrent & we discussed restarting the Lyrica...          Problem List:   R/O OSA >> 12/12 he mentioned that his girlfriend c/o his snoring & notes that he stops breathing as well;  Pt denies sleep prob & says he wakes refreshed, no daytime hypersomnolence or sleep pressure;  We discussed the need for Sleep Study & he will sched at his convenience ==> pending.  ALLERGIC RHINITIS >> incr allergy symptoms w/ exposure to his girlfriends dog; we discussed checking IgE level & RAST test ==> pending...  In the meanwhile they will try to control the environmental exposure, use high quality air cleaner in bedroom,  & take OTC antihist daily...  Hx of ASTHMATIC BRONCHITIS - he is an ex-smoker, quit >62yrs ago... presented 12/11 w/ several week hx incr SOB, min cough, no sputum, denies f/c/s, no sinus or upper resp symptoms; he treated self w/ OTC Primateen like med & improved; we decided to Rx w/ PROAIR Prn for now... ~  CXR 12/11 showed clear lungs, wnl... ~  PFT 12/11 showed FVC= 4.4 (84%), FEV1= 3.4 (85%), %1sec= 77, mid-flows= 91%pred, O2sat=99%.  ~  12/12: pt presented w/ incr SOB & allergy symptoms since his girlfriend moved in w/ her dog, see above; notes having to use the Proair more often & we discussed trial Symbicort250 2spBid for now... ~  CXR 12/12 showed lungs remain clear, NAD...  Hx of CHEST PAIN, ATYPICAL (ICD-786.59) - on ASA 81mg /d... he had Cardiolite 2000 which was neg... ~  EKG 12/11 showed NSR, WNL, NAD... denies CP, angina, palpit, etc... ~  EKG 12/12 showed NSR rate 68, wnl/ NAD...  GERD (ICD-530.81) - on PRILOSEC 20mg /d... notes some occas heartburn, no dysphagia etc...  IRRITABLE BOWEL SYNDROME (ICD-564.1) - occas abd discomfort & alt diarrhea/ constip... had colonoscopy 2005 which was neg x hemorrhoids... f/u planned 53yrs.  Hx of INGUINAL HERNIA (ICD-550.90) - he had left inguinal hernia repaired at age 70, and a right inguinal  hernia repair w/ mesh in 2007... he had post-op pain/ neuritis that improved w/ Lyrica, then resolved... ~  12/12: he presents c/o recurrent left inguinal burning pain & he is concerned; exam w/o recurrent hernia & it sounds like neuropathy; rec repeat trial of LYRICA starting 75mg /d then incr to Bid...  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 advised nasal saline spray.   Past Surgical History  Procedure Date  . Laparoscopic cholecystectomy 1996  . Left inguinal hernia repair age 53  . Right inguinl hernia repair w/ mesh 09/07    Dr. Janee Morn     Outpatient Encounter Prescriptions as of 03/30/2011  Medication Sig Dispense Refill  . albuterol (PROVENTIL HFA;VENTOLIN HFA) 108 (90 BASE) MCG/ACT inhaler Inhale 2 puffs into the lungs every 4 (four) hours as needed.  1 Inhaler  11  . aspirin 81 MG tablet Take 81 mg by mouth daily.        Marland Kitchen omeprazole (PRILOSEC OTC) 20 MG tablet Take 1 tablet (20 mg total) by mouth daily.  30 tablet  11  . DISCONTD: albuterol (PROVENTIL HFA;VENTOLIN HFA) 108 (90 BASE) MCG/ACT inhaler Inhale 2 puffs into the lungs every 4 (four) hours as needed.        Marland Kitchen DISCONTD: omeprazole (PRILOSEC OTC) 20 MG tablet Take 20 mg by mouth daily.        . budesonide-formoterol (SYMBICORT) 160-4.5 MCG/ACT inhaler Inhale 2 puffs into the lungs 2 (two) times daily.  1 Inhaler  11  . pregabalin (LYRICA) 75 MG capsule Take 1 capsule (75 mg total) by mouth 2 (two) times daily.  60 capsule  11    No Known Allergies   Current Medications, Allergies, Past Medical History, Past Surgical History, Family History, and Social History were reviewed in [employer] record.    Review of Systems      The patient notes some nasal congestion, drainage, cough, intermittent wheezing, left inguinal area burning discomfort, etc... The patient denies fever, chills, sweats, anorexia, fatigue, weakness, malaise, weight loss, blurring, diplopia, eye irritation, eye discharge, vision loss, eye pain, photophobia, earache, ear discharge, tinnitus, decreased hearing, nosebleeds, sore throat, hoarseness, chest pain, palpitations, syncope, dyspnea on exertion, orthopnea, PND, peripheral edema, dyspnea at rest, excessive sputum, hemoptysis, pleurisy, nausea, vomiting, diarrhea, constipation, change in bowel habits, abdominal pain, melena, hematochezia, jaundice, gas/bloating, indigestion/heartburn, dysphagia, odynophagia, dysuria, hematuria, urinary frequency, urinary hesitancy, nocturia, incontinence, back pain, joint pain, joint  swelling, muscle cramps, muscle weakness, stiffness, arthritis, sciatica, restless legs, leg pain at night, leg pain with exertion, rash, itching, dryness, suspicious lesions, paralysis, paresthesias, seizures, tremors, vertigo, transient blindness, frequent falls, frequent headaches, difficulty walking, depression, anxiety, memory loss, confusion, cold intolerance, heat intolerance, polydipsia, polyphagia, polyuria, unusual weight change, abnormal bruising, bleeding, enlarged lymph nodes, urticaria, allergic rash, hay fever, and recurrent infections.     Objective:   Physical Exam     WD, WN, 60 y/o WM in NAD... GENERAL:  Alert & oriented; pleasant & cooperative. HEENT:  [place]/AT, EOM-wnl, PERRLA, Fundi-benign, EACs-clear, TMs-wnl, NOSE-clear drainage, THROAT-clear & wnl. NECK:  Supple w/ full ROM; no JVD; normal carotid impulses w/o bruits; no thyromegaly or nodules palpated; no lymphadenopathy. CHEST:  Clear to P & A; without wheezes/ rales/ or rhonchi. HEART:  Regular Rhythm; without murmurs/ rubs/ or gallops. ABDOMEN:  Soft & nontender; normal bowel sounds; no organomegaly or masses detected. Left inguinal area  appears normal & is nontender to palpation etc... RECTAL:  Neg - prostate 2+ & nontender w/o nodules; stool hematest neg. EXT: without deformities or arthritic changes; no varicose veins/ venous insuffic/ or edema. NEURO:  CN's intact; motor testing normal; sensory testing normal; gait normal & balance OK. DERM:  No lesions noted; no rash etc...  RADIOLOGY DATA:  Reviewed in the EPIC EMR & discussed w/ the patient...  LABORATORY DATA:  Reviewed in the EPIC EMR & discussed w/ the patient...   Assessment:     R/O OSA>  He needs sleep study & will schd at his convenience...  Allergic Rhinitis>  Likely allergic to animal dander & prob other areo-allergens as well;  We discussed formal allergy testing w/ DrYoung but he prefers to check screening IgE level & RAST panel first;  We  reviewed antihist Rx and aleergen avoidance measures...  Asthmatic Bronchitis>  Prev controlled w/ Proair alone, now requiring it's use more regularly since a dog was introduced into the home;  We discussed trial Symbicort250Bid, and keeping the dog out of the bedroom w/ a high qual air cleaner...  GERD>  He denies reflux or nocturnal problems; continue Prilosec20 qd taken 30 min before a meal...  IBS>  Stable & denies prob w/ BM;  Up to date on colon screen w/ f/u due 2015...  Right Inguinal Pain>  As noted above, try LYRICA 75mg /d & incr to Bid if nec...  Hx recurrent boils>  He has MSSA in 2011, no problem since then...  NOTE:  Weight up 11# to 194# and BP sl elev on todays check= 160/100 range; we reviewed low sodium diet, get wt down, & monitor BP at home; he will call if BP> 150/90 on repeated checks...     Plan:     Patient's Medications  New Prescriptions   BUDESONIDE-FORMOTEROL (SYMBICORT) 160-4.5 MCG/ACT INHALER    Inhale 2 puffs into the lungs 2 (two) times daily.   PREGABALIN (LYRICA) 75 MG CAPSULE    Take 1 capsule (75 mg total) by mouth 2 (two) times daily.  Previous Medications   ASPIRIN 81 MG TABLET    Take 81 mg by mouth daily.    Modified Medications   Modified Medication Previous Medication   ALBUTEROL (PROVENTIL HFA;VENTOLIN HFA) 108 (90 BASE) MCG/ACT INHALER albuterol (PROVENTIL HFA;VENTOLIN HFA) 108 (90 BASE) MCG/ACT inhaler      Inhale 2 puffs into the lungs every 4 (four) hours as needed.    Inhale 2 puffs into the lungs every 4 (four) hours as needed.     OMEPRAZOLE (PRILOSEC OTC) 20 MG TABLET omeprazole (PRILOSEC OTC) 20 MG tablet      Take 1 tablet (20 mg total) by mouth daily.    Take 20 mg by mouth daily.    Discontinued Medications   No medications on file

## 2011-05-15 ENCOUNTER — Ambulatory Visit (HOSPITAL_BASED_OUTPATIENT_CLINIC_OR_DEPARTMENT_OTHER): Payer: PRIVATE HEALTH INSURANCE | Attending: Pulmonary Disease | Admitting: General Practice

## 2011-05-15 VITALS — Ht 72.0 in | Wt 195.0 lb

## 2011-05-15 DIAGNOSIS — G4730 Sleep apnea, unspecified: Secondary | ICD-10-CM | POA: Insufficient documentation

## 2011-05-15 DIAGNOSIS — G4733 Obstructive sleep apnea (adult) (pediatric): Secondary | ICD-10-CM

## 2011-05-15 DIAGNOSIS — G4710 Hypersomnia, unspecified: Secondary | ICD-10-CM | POA: Insufficient documentation

## 2011-05-15 DIAGNOSIS — R0683 Snoring: Secondary | ICD-10-CM

## 2011-05-19 DIAGNOSIS — G4730 Sleep apnea, unspecified: Secondary | ICD-10-CM

## 2011-05-19 DIAGNOSIS — G4710 Hypersomnia, unspecified: Secondary | ICD-10-CM

## 2011-05-19 NOTE — Procedures (Signed)
NAMEROHN, FRITSCH NO.:  [id number]  MEDICAL RECORD NO.:  [id number]          PATIENT TYPE:  OUT  LOCATION:  SLEEP CENTER                 FACILITY:  [facility]  PHYSICIAN:  Barbaraann Share, MD,FCCPDATE OF BIRTH:  Sep 04, 1950  DATE OF STUDY:  05/15/2011                           NOCTURNAL POLYSOMNOGRAM  REFERRING PHYSICIAN:  Lonzo Cloud. Kriste Basque, MD  REFERRING PHYSICIAN:  Barbaraann Share, MD, FCCP  INDICATION FOR STUDY:  Hypersomnia with sleep apnea.  EPWORTH SLEEPINESS SCORE:  12.  MEDICATIONS:  SLEEP ARCHITECTURE:  The patient had a total sleep time of 346 minutes with very little slow wave sleep, but adequate quantity of REM.  Sleep onset latency was normal at 8.5 minutes and REM onset was normal at 78.5 minutes.  Sleep efficiency was mildly reduced at 89%.  RESPIRATORY DATA:  The patient was found to have 18 obstructive apneas and 8 obstructive hypopneas, giving him an AHI of only 4.5 events per hour.  The events occurred in all body positions and there was loud snoring noted throughout.  It should be noted the patient had very little supine sleep.  OXYGEN DATA:  There was O2 desaturation as low as 87% with the patient's obstructive events.  CARDIAC DATA:  No clinically significant arrhythmias were noted.  MOVEMENT-PARASOMNIA:  The patient had no significant leg jerks or other abnormal behaviors.  IMPRESSIONS-RECOMMENDATIONS:  Small numbers of obstructive events which do not meet the apnea/hypopnea index criteria for the obstructive sleep apnea syndrome.  However, it should be noted the patient had very little supine sleep.  Treatment of his snoring can include positional therapy by avoiding supine sleep, and also modest weight loss.  Upper airway surgery and oral appliance are also potential treatments.     Barbaraann Share, MD,FCCP Diplomate, American Board of Sleep Medicine Electronically Signed    [facility]/MEDQ  D:  05/19/2011 08:43:00  T:   05/19/2011 10:27:29  Job:  [id number]

## 2011-05-31 ENCOUNTER — Encounter: Payer: Self-pay | Admitting: Pulmonary Disease

## 2011-05-31 ENCOUNTER — Ambulatory Visit (INDEPENDENT_AMBULATORY_CARE_PROVIDER_SITE_OTHER): Payer: PRIVATE HEALTH INSURANCE | Admitting: Pulmonary Disease

## 2011-05-31 DIAGNOSIS — R0789 Other chest pain: Secondary | ICD-10-CM

## 2011-05-31 DIAGNOSIS — K219 Gastro-esophageal reflux disease without esophagitis: Secondary | ICD-10-CM

## 2011-05-31 DIAGNOSIS — J45909 Unspecified asthma, uncomplicated: Secondary | ICD-10-CM

## 2011-05-31 DIAGNOSIS — R0683 Snoring: Secondary | ICD-10-CM

## 2011-05-31 DIAGNOSIS — R0609 Other forms of dyspnea: Secondary | ICD-10-CM

## 2011-05-31 DIAGNOSIS — K589 Irritable bowel syndrome without diarrhea: Secondary | ICD-10-CM

## 2011-05-31 MED ORDER — LOSARTAN POTASSIUM 100 MG PO TABS: 100.0000 mg | ORAL_TABLET | Freq: Every day | ORAL | Status: DC

## 2011-05-31 NOTE — Patient Instructions (Signed)
Today we updated your med list in our EPIC system...    Continue your current medications the same...    Continue the Symbicort as directed...    Get a copy of your insurance company dry formulary for reference...  Today we wrote a new prescription for LOSARTAN 100mg  take one tab daily for BP...    Continue to monitor your BP at home as you are doing...    Call for any questions...  We will arrange for a Sleep Med consultation w/ DrClance...  Congrats on your wedding & have a great time on your honeymoon!!!  Let's plan a follow up visit in 3-4 month range.Marland KitchenMarland Kitchen

## 2011-06-01 ENCOUNTER — Encounter: Payer: Self-pay | Admitting: Pulmonary Disease

## 2011-06-01 NOTE — Progress Notes (Signed)
Subjective:     Patient ID: Trevor Watson, male   DOB: Feb 02, 1951, 61 y.o.   MRN: [id number]  HPI 61 y/o WM here for a follow up visit and CPX...    ~  March 30, 2010:  he saw TP 7/11 w/ "boils" and cultures grew MSSA- he was treated topically & w/ Keflex, then Avelox, & it resolved w/o recurrence so far... recently he's noted some incr SOB- w/ cold ait & paint fumes; treated self w/ OTC Primateen like inhaler & improved... recall hx AB in past, ex-smoker quit >50yrs, & we discussed RADS etc... see CXR, PFTs below & we decided to treat w/ PROAIT HFA Prn for now & monitor him...  ~  March 30, 2011:  Yearly ROV & CPX> he's had a pretty good year he says but he's noted incr prob w/ his breathing & allergy symptoms ever since his girlfriend moved in w/ her dog, SOB/ wheezing/ cough, & eyes watering, etc; certainly sounds allergic & we discussed checking IgE & RAST tests; in the meanwhile try to avoid contact, keep dog groomed, freq cleaning & vacuuming, keep dog out of bedroom & use hi quality air purifier in there, take antihist daily...  He also says that girlfriend notes that he snores & stops breathing> c/w OSA & we discussed the need for Sleep Study... Finally he notes recurrent right inguinal pain s/p RIH repair w/ mesh in 2007; prev had to take Lyrica which helped & he weaned off- now recurrent & we discussed restarting the Lyrica...  ~  February 13,2013:  65mo ROV> After his physical 12/12 we decided to do several things:    1) Allergy issues w/ fiance's dog> IgE=61 & +RAST to Dog>dust>cat>some molds>ragweed; they will keep dog out of bedroom & use good room size air cleaner...    2) R/O Sleep Apnea +snoring> sleep study 05/15/11 w/ AHI=4.5/hr, loud snoring, mild desat; partner states loud snoring in all positions (he thought just supine); looks like he will need Rx w/ CPAP trial, oral appliance, other options;  We decided to seek consult from DrClance, Sleep Medicine, for his review...  3) He had borderline HBP & asked to monitor at home> ave is 150/90 & he needs antihypertensive medication; we decided to start therapy w/ LOSARTAN 100mg /d...          Problem List:   R/O OSA >> 12/12 he mentioned that his girlfriend c/o his snoring & notes that he stops breathing as well;  Pt denies sleep prob & says he wakes refreshed, no daytime hypersomnolence or sleep pressure;  We discussed the need for Sleep Study & he will sched at his convenience ==> done 05/15/11 w/ AHI=4.5/hr, loud snoring & transient desat to 87%... Looks like he will need treatment for this & his snoring> we decided to refer to DrClance for sleep consult...  ALLERGIC RHINITIS >> incr allergy symptoms w/ exposure to his girlfriends dog; we discussed checking IgE level (61)& RAST test (+Dog & few others)...  In the meanwhile they will try to control the environmental exposure, use high quality air cleaner in bedroom, & take OTC antihist daily...  Hx of ASTHMATIC BRONCHITIS - he is an ex-smoker, quit >36yrs ago... Now on SYMBICORT 160- 2sp Bid & PROAIR HFA as needed... ~  12/11: presented w/ several week hx incr SOB, min cough, no sputum, denies f/c/s, no sinus or upper resp symptoms; he treated self w/ OTC Primateen like med & improved; we decided to Rx w/ PROAIR  Prn for now... ~  CXR 12/11 showed clear lungs, wnl... ~  PFT 12/11 showed FVC= 4.4 (84%), FEV1= 3.4 (85%), %1sec= 77, mid-flows= 91%pred, O2sat=99%.  ~  12/12: pt presented w/ incr SOB & allergy symptoms since his girlfriend moved in w/ her dog, see above; notes having to use the Proair more often & we discussed trial Symbicort160 2spBid for now... ~  CXR 12/12 showed lungs remain clear, NAD... ~  2/13: he reports MUCH improved w/ the Symbicort; rec continue same med...  BORDERLINE HYPERTENSION >> noted intermittent BP elev in past; during CPX 12/12 BP was 150/90 & asked to monitor at home for several months; returned 2/13 w/ BP log indicating that ave BP was  indeed ~150/90, therefore decision to start treatment w/ LOSARTAN 100mg /d plus diet & exercise...  Hx of CHEST PAIN, ATYPICAL (ICD-786.59) - on ASA 81mg /d... he had Cardiolite 2000 which was neg... ~  EKG 12/11 showed NSR, WNL, NAD... denies CP, angina, palpit, etc... ~  EKG 12/12 showed NSR rate 68, wnl/ NAD...  GERD (ICD-530.81) - on PRILOSEC 20mg /d... notes some occas heartburn, no dysphagia etc...  IRRITABLE BOWEL SYNDROME (ICD-564.1) - occas abd discomfort & alt diarrhea/ constip... had colonoscopy 2005 which was neg x hemorrhoids... f/u planned 79yrs.  Hx of INGUINAL HERNIA (ICD-550.90) - he had left inguinal hernia repaired at age 13, and a right inguinal hernia repair w/ mesh in 2007... he had post-op pain/ neuritis that improved w/ Lyrica, then resolved... ~  12/12: he presents c/o recurrent left inguinal burning pain & he is concerned; exam w/o recurrent hernia & it sounds like neuropathy; rec repeat trial of LYRICA starting 75mg /d then incr to Bid...  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 advised nasal saline spray.   Past Surgical History  Procedure Date  . Laparoscopic cholecystectomy 1996  . Left inguinal hernia repair age 17  . Right inguinl hernia repair w/ mesh 09/07    Dr. Janee Morn    Outpatient Encounter Prescriptions as of 05/31/2011  Medication Sig Dispense Refill  . albuterol (PROVENTIL HFA;VENTOLIN HFA) 108 (90 BASE) MCG/ACT inhaler Inhale 2 puffs into the lungs every 4 (four) hours as needed.  1 Inhaler  11  . aspirin 81 MG tablet Take 81 mg by mouth daily.        . budesonide-formoterol (SYMBICORT) 160-4.5 MCG/ACT inhaler Inhale 2 puffs into the lungs 2 (two) times daily.  1 Inhaler  11  . omeprazole (PRILOSEC OTC) 20 MG tablet Take 1 tablet (20 mg total) by mouth daily.  30 tablet  11  . pregabalin (LYRICA) 75 MG capsule Take 1 capsule (75 mg total) by mouth 2 (two)  times daily.  60 capsule  11  . losartan (COZAAR) 100 MG tablet Take 1 tablet (100 mg total) by mouth daily.  90 tablet  3    No Known Allergies   Current Medications, Allergies, Past Medical History, Past Surgical History, Family History, and Social History were reviewed in [employer] record.    Review of Systems      The patient notes some nasal congestion, drainage, cough, intermittent wheezing, left inguinal area burning discomfort, etc... The patient denies fever, chills, sweats, anorexia, fatigue, weakness, malaise, weight loss, blurring, diplopia, eye irritation, eye discharge, vision loss, eye pain, photophobia, earache, ear discharge, tinnitus, decreased hearing, nosebleeds, sore throat, hoarseness, chest pain, palpitations, syncope, dyspnea  on exertion, orthopnea, PND, peripheral edema, dyspnea at rest, excessive sputum, hemoptysis, pleurisy, nausea, vomiting, diarrhea, constipation, change in bowel habits, abdominal pain, melena, hematochezia, jaundice, gas/bloating, indigestion/heartburn, dysphagia, odynophagia, dysuria, hematuria, urinary frequency, urinary hesitancy, nocturia, incontinence, back pain, joint pain, joint swelling, muscle cramps, muscle weakness, stiffness, arthritis, sciatica, restless legs, leg pain at night, leg pain with exertion, rash, itching, dryness, suspicious lesions, paralysis, paresthesias, seizures, tremors, vertigo, transient blindness, frequent falls, frequent headaches, difficulty walking, depression, anxiety, memory loss, confusion, cold intolerance, heat intolerance, polydipsia, polyphagia, polyuria, unusual weight change, abnormal bruising, bleeding, enlarged lymph nodes, urticaria, allergic rash, hay fever, and recurrent infections.     Objective:   Physical Exam     WD, WN, 61 y/o WM in NAD... GENERAL:  Alert & oriented; pleasant & cooperative. HEENT:  [place]/AT, EOM-wnl, PERRLA, Fundi-benign, EACs-clear, TMs-wnl, NOSE-clear  drainage, THROAT-clear & wnl. NECK:  Supple w/ full ROM; no JVD; normal carotid impulses w/o bruits; no thyromegaly or nodules palpated; no lymphadenopathy. CHEST:  Clear to P & A; without wheezes/ rales/ or rhonchi. HEART:  Regular Rhythm; without murmurs/ rubs/ or gallops. ABDOMEN:  Soft & nontender; normal bowel sounds; no organomegaly or masses detected. Left inguinal area appears normal & is nontender to palpation etc... RECTAL:  Neg - prostate 2+ & nontender w/o nodules; stool hematest neg. EXT: without deformities or arthritic changes; no varicose veins/ venous insuffic/ or edema. NEURO:  CN's intact; motor testing normal; sensory testing normal; gait normal & balance OK. DERM:  No lesions noted; no rash etc...  RADIOLOGY DATA:  Reviewed in the EPIC EMR & discussed w/ the patient...  LABORATORY DATA:  Reviewed in the EPIC EMR & discussed w/ the patient...   Assessment:     R/O OSA>  His sleep study revealed an AHI=4.5 only but lots of loud snoring & transient decr sat to 87%; fiancee is affected by the snoring & it looks like he will need some guidance regarding poss CPAP trial, oral appliance, etc... We discussed refer to DrClance...  Allergic Rhinitis>  IgE was 61 and RAST +Dog among others;  He is not inclined to do further allergy testing at this time, but must work harder at control of the dander, keep  dog out of bedroom, set up air cleaner in bedroom, etc...  Asthmatic Bronchitis>  Symbicort 160- 2sp Bid really helping he says, and rarely needs the Proair now...  HYPERTENSION>  His home BP checks have confirmed mild HBP & we have decided to initiate therapy w/ LOSARTAN 100mg /d...  GERD>  He denies reflux or nocturnal problems; continue Prilosec20 qd taken 30 min before a meal...  IBS>  Stable & denies prob w/ BM;  Up to date on colon screen w/ f/u due 2015...  Right Inguinal Pain>  As noted above, try LYRICA 75mg /d & incr to Bid if nec...  Hx recurrent boils>  He has  MSSA in 2011, no problem since then...  NOTE:  Weight up 11# to 194# and BP sl elev on todays check= 160/100 range; we reviewed low sodium diet, get wt down, & monitor BP at home; he will call if BP> 150/90 on repeated checks...     Plan:     Patient's Medications  New Prescriptions   LOSARTAN (COZAAR) 100 MG TABLET    Take 1 tablet (100 mg total) by mouth daily.  Previous Medications   ALBUTEROL (PROVENTIL HFA;VENTOLIN HFA) 108 (90 BASE) MCG/ACT INHALER    Inhale 2 puffs into the lungs every 4 (  four) hours as needed.   ASPIRIN 81 MG TABLET    Take 81 mg by mouth daily.     BUDESONIDE-FORMOTEROL (SYMBICORT) 160-4.5 MCG/ACT INHALER    Inhale 2 puffs into the lungs 2 (two) times daily.   OMEPRAZOLE (PRILOSEC OTC) 20 MG TABLET    Take 1 tablet (20 mg total) by mouth daily.   PREGABALIN (LYRICA) 75 MG CAPSULE    Take 1 capsule (75 mg total) by mouth 2 (two) times daily.  Modified Medications   No medications on file  Discontinued Medications   No medications on file

## 2011-06-20 ENCOUNTER — Ambulatory Visit (INDEPENDENT_AMBULATORY_CARE_PROVIDER_SITE_OTHER): Payer: PRIVATE HEALTH INSURANCE | Admitting: Pulmonary Disease

## 2011-06-20 ENCOUNTER — Encounter: Payer: Self-pay | Admitting: Pulmonary Disease

## 2011-06-20 VITALS — BP 156/98 | HR 69 | Temp 98.4°F | Ht 72.0 in | Wt 198.0 lb

## 2011-06-20 DIAGNOSIS — R0609 Other forms of dyspnea: Secondary | ICD-10-CM

## 2011-06-20 DIAGNOSIS — R0683 Snoring: Secondary | ICD-10-CM

## 2011-06-20 NOTE — Assessment & Plan Note (Signed)
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  This is not a health issue for him, but can impact his QOL and bed partner's sleep.  Treatment options include weight loss, positional therapy, surgery, dental appliance.  I would not recommend cpap since he is not overly symptomatic, and may find the inconvenience of cpap not worth the benefit.  He really does not have a very abnormal upper airway, and therefore surgery is not a good option.  I have discussed all of the options with him, and he would like to take some time to work aggressively on modest weight loss.  If he continues to have issues, he might consider a snoring appliance.

## 2011-06-20 NOTE — Patient Instructions (Signed)
Work on weight loss, try to stay off back as much as possible. If your snoring continues to be a quality of life issue, can consider a dental appliance. followup with me as needed.

## 2011-06-20 NOTE — Progress Notes (Signed)
  Subjective:    Patient ID: Trevor Watson, male    DOB: [birth year], 61 y.o.   MRN: [id number]  HPI The patient is a very pleasant 61 year old male who been asked to see for loud snoring.  He is been noted to have loud snoring by his bed partner, as well as an abnormal breathing pattern during sleep.  The patient denies any choking arousals.  He has recently undergone a sleep study, where he was found to have an apnea hypopnea index of only 4.5 events per hour.  He also had loud snoring noted throughout.  The patient states that he usually feels rested in the mornings upon arising, but does admit that he can feel sleep pressure at lunch when he gets quiet.  He also has some sleep pressure during the day at work with periods of inactivity.  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  He also has occasional sleep pressure in the evenings while watching television or movies, but no issues with driving.  He states that his weight is up about 15 pounds over the last 2 years, and his Epworth sleepiness score at the time of his sleep study was 12.  Sleep Questionnaire: What time do you typically go to bed?( Between what hours) 11:00 How long does it take you to fall asleep? 5 mins How many times during the night do you wake up? 1 What time do you get out of bed to start your day? 0600 Do you drive or operate heavy machinery in your occupation? No How much has your weight changed (up or down) over the past two years? (In pounds) 15 lb (6.804 kg) Have you ever had a sleep study before? Yes If yes, location of study? [facility] If yes, date of study? 04/2011 Do you currently use CPAP? No Do you wear oxygen at any time? No    Review of Systems  Constitutional: Negative for fever and unexpected weight change.  HENT: Positive for congestion. Negative for ear pain, nosebleeds, sore throat, rhinorrhea, sneezing, trouble swallowing, dental problem, postnasal drip and sinus pressure.   Eyes: Negative for  redness and itching.  Respiratory: Positive for cough. Negative for chest tightness, shortness of breath and wheezing.   Cardiovascular: Negative for palpitations and leg swelling.  Gastrointestinal: Negative for nausea and vomiting.  Genitourinary: Negative for dysuria.  Musculoskeletal: Negative for joint swelling.  Skin: Negative for rash.  Neurological: Negative for headaches.  Hematological: Does not bruise/bleed easily.  Psychiatric/Behavioral: Negative for dysphoric mood. The patient is not nervous/anxious.        Objective:   Physical Exam Constitutional:  Well developed, no acute distress  HENT:  Nares patent without discharge  Oropharynx without exudate, palate and uvula are elongated.  Eyes:  Perrla, eomi, no scleral icterus  Neck:  No JVD, no TMG  Cardiovascular:  Normal rate, regular rhythm, no rubs or gallops.  No murmurs        Intact distal pulses  Pulmonary :  Normal breath sounds, no stridor or respiratory distress   No rales, rhonchi, or wheezing  Abdominal:  Soft, nondistended, bowel sounds present.  No tenderness noted.   Musculoskeletal:  No lower extremity edema noted.  Lymph Nodes:  No cervical lymphadenopathy noted  Skin:  No cyanosis noted  Neurologic:  Alert, appropriate, moves all 4 extremities without obvious deficit.         Assessment & Plan:

## 2011-10-11 ENCOUNTER — Encounter: Payer: Self-pay | Admitting: Pulmonary Disease

## 2011-10-11 ENCOUNTER — Ambulatory Visit (INDEPENDENT_AMBULATORY_CARE_PROVIDER_SITE_OTHER): Payer: PRIVATE HEALTH INSURANCE | Admitting: Pulmonary Disease

## 2011-10-11 VITALS — BP 140/82 | HR 64 | Temp 97.4°F | Ht 72.0 in | Wt 200.8 lb

## 2011-10-11 DIAGNOSIS — I10 Essential (primary) hypertension: Secondary | ICD-10-CM

## 2011-10-11 DIAGNOSIS — R0683 Snoring: Secondary | ICD-10-CM

## 2011-10-11 DIAGNOSIS — J45909 Unspecified asthma, uncomplicated: Secondary | ICD-10-CM

## 2011-10-11 DIAGNOSIS — R0609 Other forms of dyspnea: Secondary | ICD-10-CM

## 2011-10-11 DIAGNOSIS — K589 Irritable bowel syndrome without diarrhea: Secondary | ICD-10-CM

## 2011-10-11 DIAGNOSIS — K219 Gastro-esophageal reflux disease without esophagitis: Secondary | ICD-10-CM

## 2011-10-11 NOTE — Patient Instructions (Addendum)
Today we updated your med list in our EPIC system...    Continue your current medications the same...  Remember to work on wt reduction & stay away from SALT/ sodium...  Call for any problems...  Let's plan our f/u physical in December.Marland KitchenMarland Kitchen

## 2011-10-11 NOTE — Progress Notes (Signed)
Subjective:     Patient ID: Trevor Watson, male   DOB: December 07, 1950, 61 y.o.   MRN: [id number]  HPI 61 y/o WM here for a follow up visit and CPX...    ~  March 30, 2010:  he saw TP 7/11 w/ "boils" and cultures grew MSSA- he was treated topically & w/ Keflex, then Avelox, & it resolved w/o recurrence so far... recently he's noted some incr SOB- w/ cold ait & paint fumes; treated self w/ OTC Primateen like inhaler & improved... recall hx AB in past, ex-smoker quit >60yrs, & we discussed RADS etc... see CXR, PFTs below & we decided to treat w/ PROAIT HFA Prn for now & monitor him...  ~  March 30, 2011:  Yearly ROV & CPX> he's had a pretty good year he says but he's noted incr prob w/ his breathing & allergy symptoms ever since his girlfriend moved in w/ her dog, SOB/ wheezing/ cough, & eyes watering, etc; certainly sounds allergic & we discussed checking IgE & RAST tests; in the meanwhile try to avoid contact, keep dog groomed, freq cleaning & vacuuming, keep dog out of bedroom & use hi quality air purifier in there, take antihist daily...  He also says that girlfriend notes that he snores & stops breathing> c/w OSA & we discussed the need for Sleep Study... Finally he notes recurrent right inguinal pain s/p RIH repair w/ mesh in 2007; prev had to take Lyrica which helped & he weaned off- now recurrent & we discussed restarting the Lyrica...  ~  February 13,2013:  68mo ROV> After his physical 12/12 we decided to do several things:    1) Allergy issues w/ fiance's dog> IgE=61 & +RAST to Dog>dust>cat>some molds>ragweed; they will keep dog out of bedroom & use good room size air cleaner...    2) R/O Sleep Apnea +snoring> sleep study 05/15/11 w/ AHI=4.5/hr, loud snoring, mild desat; partner states loud snoring in all positions (he thought just supine); looks like he will need Rx w/ CPAP trial, oral appliance, other options;  We decided to seek consult from DrClance, Sleep Medicine, for his review...  3) He had borderline HBP & asked to monitor at home> ave is 150/90 & he needs antihypertensive medication; we decided to start therapy w/ LOSARTAN 100mg /d...  ~  October 11, 2011:  48mo ROV Trevor Watson has been keeping a log of his BPs at home- Ave 140/80 on the Losartan & tol well; measures 140/82 here today & he denies CP, palpit, SOB, dizzy, syncope, edema...    He had sleep consult w/ DrClance 3/13> they decided on observation for now as it is not a health risk, no daytime hypersomnolence & his wife is not bothered by his snoring... They did agree to keep the dog out of the bedroom but he hasn't as yet got a room sized electrostatic air cleaner...     We reviewed prob list, meds, xrays and labs> see below>> he will f/u in 21mo for his yearly CPX...          Problem List:   R/O OSA >> 12/12 he mentioned that his girlfriend c/o his snoring & notes that he stops breathing as well;  Pt denies sleep prob & says he wakes refreshed, no daytime hypersomnolence or sleep pressure;  We discussed the need for Sleep Study & he will sched at his convenience ==> done 05/15/11 w/ AHI=4.5/hr, loud snoring & transient desat to 87%... Looks like he will need treatment for this &  his snoring> we decided to refer to DrClance for sleep consult... ~  Sleep consult by DrClance 3/13> prob upper airway resistance syndrome, he decided to work on wt reduction & forego poss CPAP or dental appliance Rx...  ALLERGIC RHINITIS >> incr allergy symptoms w/ exposure to his girlfriends dog; we discussed checking IgE level (61)& RAST test (+Dog & few others)...  In the meanwhile they will try to control the environmental exposure, use high quality air cleaner in bedroom, & take OTC antihist daily...  Hx of ASTHMATIC BRONCHITIS - he is an ex-smoker, quit >36yrs ago... Now on SYMBICORT 160- 2sp Bid & PROAIR HFA as needed... ~  12/11: presented w/ several week hx incr SOB, min cough, no sputum, denies f/c/s, no sinus or upper resp symptoms; he  treated self w/ OTC Primateen like med & improved; we decided to Rx w/ PROAIR Prn for now... ~  CXR 12/11 showed clear lungs, wnl... ~  PFT 12/11 showed FVC= 4.4 (84%), FEV1= 3.4 (85%), %1sec= 77, mid-flows= 91%pred, O2sat=99%.  ~  12/12: pt presented w/ incr SOB & allergy symptoms since his girlfriend moved in w/ her dog, see above; notes having to use the Proair more often & we discussed trial Symbicort160 2spBid for now... ~  CXR 12/12 showed lungs remain clear, NAD... ~  2/13: he reports MUCH improved w/ the Symbicort; rec continue same med...  BORDERLINE HYPERTENSION >> noted intermittent BP elev in past; during CPX 12/12 BP was 150/90 & asked to monitor at home for several months; returned 2/13 w/ BP log indicating that ave BP was indeed ~150/90, therefore decision to start treatment w/ LOSARTAN 100mg /d plus diet & exercise... ~  6/13:  BP= 140/82 on the Losartan100 & he remains asymtomatic...  Hx of CHEST PAIN, ATYPICAL (ICD-786.59) - on ASA 81mg /d... he had Cardiolite 2000 which was neg... ~  EKG 12/11 showed NSR, WNL, NAD... denies CP, angina, palpit, etc... ~  EKG 12/12 showed NSR rate 68, wnl/ NAD...  GERD (ICD-530.81) - on PRILOSEC 20mg /d... notes some occas heartburn, no dysphagia etc...  IRRITABLE BOWEL SYNDROME (ICD-564.1) - occas abd discomfort & alt diarrhea/ constip... had colonoscopy 2005 which was neg x hemorrhoids... f/u planned 73yrs.  Hx of INGUINAL HERNIA (ICD-550.90) - he had left inguinal hernia repaired at age 12, and a right inguinal hernia repair w/ mesh in 2007... he had post-op pain/ neuritis that improved w/ Lyrica, then resolved... ~  12/12: he presents c/o recurrent left inguinal burning pain & he is concerned; exam w/o recurrent hernia & it sounds like neuropathy; rec repeat trial of LYRICA starting 75mg /d then incr to Bid...  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  advised nasal saline spray.   Past Surgical History  Procedure Date  . Laparoscopic cholecystectomy 1996  . Left inguinal hernia repair age 41  . Right inguinl hernia repair w/ mesh 09/07    Dr. Janee Morn    Outpatient Encounter Prescriptions as of 10/11/2011  Medication Sig Dispense Refill  . albuterol (PROVENTIL HFA;VENTOLIN HFA) 108 (90 BASE) MCG/ACT inhaler Inhale 2 puffs into the lungs every 4 (four) hours as needed.  1 Inhaler  11  . aspirin 81 MG tablet Take 81 mg by mouth daily.        . budesonide-formoterol (SYMBICORT) 160-4.5 MCG/ACT inhaler Inhale 2 puffs into the lungs 2 (two) times daily.  1 Inhaler  11  .  losartan (COZAAR) 100 MG tablet Take 1 tablet (100 mg total) by mouth daily.  90 tablet  3  . omeprazole (PRILOSEC OTC) 20 MG tablet Take 1 tablet (20 mg total) by mouth daily.  30 tablet  11    No Known Allergies   Current Medications, Allergies, Past Medical History, Past Surgical History, Family History, and Social History were reviewed in [employer] [facility] record.    Review of Systems      The patient notes some nasal congestion, drainage, cough, intermittent wheezing, left inguinal area burning discomfort, etc... The patient denies fever, chills, sweats, anorexia, fatigue, weakness, malaise, weight loss, blurring, diplopia, eye irritation, eye discharge, vision loss, eye pain, photophobia, earache, ear discharge, tinnitus, decreased hearing, nosebleeds, sore throat, hoarseness, chest pain, palpitations, syncope, dyspnea on exertion, orthopnea, PND, peripheral edema, dyspnea at rest, excessive sputum, hemoptysis, pleurisy, nausea, vomiting, diarrhea, constipation, change in bowel habits, abdominal pain, melena, hematochezia, jaundice, gas/bloating, indigestion/heartburn, dysphagia, odynophagia, dysuria, hematuria, urinary frequency, urinary hesitancy, nocturia, incontinence, back pain, joint pain, joint swelling, muscle cramps, muscle weakness, stiffness,  arthritis, sciatica, restless legs, leg pain at night, leg pain with exertion, rash, itching, dryness, suspicious lesions, paralysis, paresthesias, seizures, tremors, vertigo, transient blindness, frequent falls, frequent headaches, difficulty walking, depression, anxiety, memory loss, confusion, cold intolerance, heat intolerance, polydipsia, polyphagia, polyuria, unusual weight change, abnormal bruising, bleeding, enlarged lymph nodes, urticaria, allergic rash, hay fever, and recurrent infections.     Objective:   Physical Exam     WD, WN, 61 y/o WM in NAD... GENERAL:  Alert & oriented; pleasant & cooperative. HEENT:  [place]/AT, EOM-wnl, PERRLA, Fundi-benign, EACs-clear, TMs-wnl, NOSE-clear drainage, THROAT-clear & wnl. NECK:  Supple w/ full ROM; no JVD; normal carotid impulses w/o bruits; no thyromegaly or nodules palpated; no lymphadenopathy. CHEST:  Clear to P & A; without wheezes/ rales/ or rhonchi. HEART:  Regular Rhythm; without murmurs/ rubs/ or gallops. ABDOMEN:  Soft & nontender; normal bowel sounds; no organomegaly or masses detected. Left inguinal area appears normal & is nontender to palpation etc... RECTAL:  Neg - prostate 2+ & nontender w/o nodules; stool hematest neg. EXT: without deformities or arthritic changes; no varicose veins/ venous insuffic/ or edema. NEURO:  CN's intact; motor testing normal; sensory testing normal; gait normal & balance OK. DERM:  No lesions noted; no rash etc...  RADIOLOGY DATA:  Reviewed in the EPIC EMR & discussed w/ the patient...  LABORATORY DATA:  Reviewed in the EPIC EMR & discussed w/ the patient...   Assessment:     R/O OSA>  His sleep study revealed an AHI=4.5 only but lots of loud snoring & transient decr sat to 87%; fiancee is affected by the snoring; He had sleep consult w/ DrClance & they felt it was not a health issue & he decided to work on wt reduction first...  Allergic Rhinitis>  IgE was 61 and RAST +Dog among others;  He is not  inclined to do further allergy testing at this time, but must work harder at control of the dander, keep  dog out of bedroom, set up air cleaner in bedroom, etc...  Asthmatic Bronchitis>  Symbicort 160- 2sp Bid really helping he says, and rarely needs the Proair now...  HYPERTENSION>  His home BP checks have confirmed mild HBP & we have decided to initiate therapy w/ LOSARTAN 100mg /d...  GERD>  He denies reflux or nocturnal problems; continue Prilosec20 qd taken 30 min before a meal...  IBS>  Stable & denies prob w/ BM;  Up to date on colon screen w/ f/u due 2015...  Right Inguinal Pain>  As noted above, try LYRICA 75mg /d & incr to Bid if nec...  Hx recurrent boils>  He has MSSA in 2011, no problem since then...     Plan:     Patient's Medications  New Prescriptions   No medications on file  Previous Medications   ALBUTEROL (PROVENTIL HFA;VENTOLIN HFA) 108 (90 BASE) MCG/ACT INHALER    Inhale 2 puffs into the lungs every 4 (four) hours as needed.   ASPIRIN 81 MG TABLET    Take 81 mg by mouth daily.     BUDESONIDE-FORMOTEROL (SYMBICORT) 160-4.5 MCG/ACT INHALER    Inhale 2 puffs into the lungs 2 (two) times daily.   LOSARTAN (COZAAR) 100 MG TABLET    Take 1 tablet (100 mg total) by mouth daily.   OMEPRAZOLE (PRILOSEC OTC) 20 MG TABLET    Take 1 tablet (20 mg total) by mouth daily.  Modified Medications   No medications on file  Discontinued Medications   No medications on file

## 2011-10-19 ENCOUNTER — Encounter: Payer: Self-pay | Admitting: Pulmonary Disease

## 2012-04-01 ENCOUNTER — Ambulatory Visit: Payer: PRIVATE HEALTH INSURANCE | Admitting: Pulmonary Disease

## 2012-06-12 ENCOUNTER — Telehealth: Payer: Self-pay | Admitting: Pulmonary Disease

## 2012-06-12 MED ORDER — LOSARTAN POTASSIUM 100 MG PO TABS: 100.0000 mg | ORAL_TABLET | Freq: Every day | ORAL | Status: DC

## 2012-06-12 NOTE — Telephone Encounter (Signed)
Rx has been sent in, pt is aware. 

## 2012-06-19 ENCOUNTER — Ambulatory Visit: Payer: PRIVATE HEALTH INSURANCE | Admitting: Pulmonary Disease

## 2012-06-25 ENCOUNTER — Ambulatory Visit (INDEPENDENT_AMBULATORY_CARE_PROVIDER_SITE_OTHER): Payer: PRIVATE HEALTH INSURANCE | Admitting: Pulmonary Disease

## 2012-06-25 ENCOUNTER — Encounter: Payer: Self-pay | Admitting: Pulmonary Disease

## 2012-06-25 VITALS — BP 132/84 | HR 70 | Temp 99.3°F | Ht 72.0 in | Wt 201.8 lb

## 2012-06-25 DIAGNOSIS — R0609 Other forms of dyspnea: Secondary | ICD-10-CM

## 2012-06-25 MED ORDER — ALBUTEROL SULFATE HFA 108 (90 BASE) MCG/ACT IN AERS: 2.0000 | INHALATION_SPRAY | RESPIRATORY_TRACT | Status: DC | PRN

## 2012-06-25 NOTE — Progress Notes (Signed)
Subjective:     Patient ID: Trevor Watson, male   DOB: 1951/03/01, 62 y.o.   MRN: [id number]  HPI 62 y/o WM here for a follow up visit...    ~  March 30, 2011:  Yearly ROV & CPX> he's had a pretty good year he says but he's noted incr prob w/ his breathing & allergy symptoms ever since his girlfriend moved in w/ her dog, SOB/ wheezing/ cough, & eyes watering, etc; certainly sounds allergic & we discussed checking IgE & RAST tests; in the meanwhile try to avoid contact, keep dog groomed, freq cleaning & vacuuming, keep dog out of bedroom & use hi quality air purifier in there, take antihist daily...  He also says that girlfriend notes that he snores & stops breathing> c/w OSA & we discussed the need for Sleep Study... Finally he notes recurrent right inguinal pain s/p RIH repair w/ mesh in 2007; prev had to take Lyrica which helped & he weaned off- now recurrent & we discussed restarting the Lyrica...  ~  February 13,2013:  42mo ROV> After his physical 12/12 we decided to do several things:    1) Allergy issues w/ fiance's dog> IgE=61 & +RAST to Dog>dust>cat>some molds>ragweed; they will keep dog out of bedroom & use good room size air cleaner...    2) R/O Sleep Apnea +snoring> sleep study 05/15/11 w/ AHI=4.5/hr, loud snoring, mild desat; partner states loud snoring in all positions (he thought just supine); looks like he will need Rx w/ CPAP trial, oral appliance, other options;  We decided to seek consult from DrClance, Sleep Medicine, for his review...    3) He had borderline HBP & asked to monitor at home> ave is 150/90 & he needs antihypertensive medication; we decided to start therapy w/ LOSARTAN 100mg /d...  ~  October 11, 2011:  2mo ROV Trevor Watson has been keeping a log of his BPs at home- Ave 140/80 on the Losartan & tol well; measures 140/82 here today & he denies CP, palpit, SOB, dizzy, syncope, edema...    He had sleep consult w/ DrClance 3/13> they decided on observation for now as it is not a  health risk, no daytime hypersomnolence & his wife is not bothered by his snoring... They did agree to keep the dog out of the bedroom but he hasn't as yet got a room sized electrostatic air cleaner...     We reviewed prob list, meds, xrays and labs> see below >>   ~  June 25, 2012:  8-342mo ROV & Trevor Watson is much improved now that wife's dog has been ret to her ex-husb; he has been able to stop the Symbicort & using the Proair as needed (not often); breathing is normal- denies cough, sput, hemoptysis, SOB, etc...  On Losartan100 for BP= 132/84 today & feeling well w/o CP, palpit, dizzy, SOB, edema, etc... Similarly reflux symptoms well controlled on Omeprazole20 and he denies abd pain, dysphagia, n/v, c/d, blood seen etc...  He requests Cialis- done...    We reviewed prob list, meds, xrays and labs> see below for updates >> refills for 90d to [facility]...          Problem List:   R/O OSA >> 12/12 he mentioned that his girlfriend c/o his snoring & notes that he stops breathing as well;  Pt denies sleep prob & says he wakes refreshed, no daytime hypersomnolence or sleep pressure;  We discussed the need for Sleep Study & he will sched at his convenience ==> done  05/15/11 w/ AHI=4.5/hr, loud snoring & transient desat to 87%... Looks like he will need treatment for this & his snoring> we decided to refer to DrClance for sleep consult... ~  Sleep consult by DrClance 3/13> prob upper airway resistance syndrome, he decided to work on wt reduction & forego poss CPAP or dental appliance Rx...  ALLERGIC RHINITIS >> incr allergy symptoms w/ exposure to his girlfriends dog; we discussed checking IgE level (61)& RAST test (+Dog & few others)...  In the meanwhile they will try to control the environmental exposure, use high quality air cleaner in bedroom, & take OTC antihist daily... ~  3/14: allergy symptoms markedly reduced by removing the dog...  Hx of ASTHMATIC BRONCHITIS - he is an ex-smoker, quit >80yrs ago... Now  on SYMBICORT 160- 2sp Bid & PROAIR HFA as needed... ~  12/11: presented w/ several week hx incr SOB, min cough, no sputum, denies f/c/s, no sinus or upper resp symptoms; he treated self w/ OTC Primateen like med & improved; we decided to Rx w/ PROAIR Prn for now... ~  CXR 12/11 showed clear lungs, wnl... ~  PFT 12/11 showed FVC= 4.4 (84%), FEV1= 3.4 (85%), %1sec= 77, mid-flows= 91%pred, O2sat=99%.  ~  12/12: pt presented w/ incr SOB & allergy symptoms since his girlfriend moved in w/ her dog, see above; notes having to use the Proair more often & we discussed trial Symbicort160 2spBid for now... ~  CXR 12/12 showed lungs remain clear, NAD... ~  2/13: he reports MUCH improved w/ the Symbicort; rec continue same med... ~  3/14: he's been off the Symbicort since the dog is gone, using Proair as needed (not often)...  BORDERLINE HYPERTENSION >> noted intermittent BP elev in past; during CPX 12/12 BP was 150/90 & asked to monitor at home for several months; returned 2/13 w/ BP log indicating that ave BP was indeed ~150/90, therefore decision to start treatment w/ LOSARTAN 100mg /d plus diet & exercise... ~  6/13:  BP= 140/82 on the Losartan100 & he remains asymtomatic... ~  3/14:  BP= 132/84 on the Losartan100 & he denies CP, palpit, SOB, edema, etc...  Hx of CHEST PAIN, ATYPICAL (ICD-786.59) - on ASA 81mg /d... he had Cardiolite 2000 which was neg... ~  EKG 12/11 showed NSR, WNL, NAD... denies CP, angina, palpit, etc... ~  EKG 12/12 showed NSR rate 68, wnl/ NAD...  GERD (ICD-530.81) - on PRILOSEC 20mg /d... notes some occas heartburn, no dysphagia etc...  IRRITABLE BOWEL SYNDROME (ICD-564.1) - occas abd discomfort & alt diarrhea/ constip... had colonoscopy 2005 which was neg x hemorrhoids... f/u planned 51yrs.  Hx of INGUINAL HERNIA (ICD-550.90) - he had left inguinal hernia repaired at age 80, and a right inguinal hernia repair w/ mesh in 2007... he had post-op pain/ neuritis that improved w/ Lyrica,  then resolved... ~  12/12: he presents c/o recurrent left inguinal burning pain & he is concerned; exam w/o recurrent hernia & it sounds like neuropathy; rec repeat trial of LYRICA starting 75mg /d then incr to Bid...  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 advised nasal saline spray.   Past Surgical History  Procedure Laterality Date  . Laparoscopic cholecystectomy  1996  . Left inguinal hernia repair  age 57  . Right inguinl hernia repair w/ mesh  09/07    Dr. Janee Morn    Outpatient Encounter Prescriptions as of 06/25/2012  Medication Sig  Dispense Refill  . albuterol (PROVENTIL HFA;VENTOLIN HFA) 108 (90 BASE) MCG/ACT inhaler Inhale 2 puffs into the lungs every 4 (four) hours as needed.  3 Inhaler  3  . aspirin 81 MG tablet Take 81 mg by mouth daily.        Marland Kitchen losartan (COZAAR) 100 MG tablet Take 1 tablet (100 mg total) by mouth daily.  90 tablet  3  . omeprazole (PRILOSEC OTC) 20 MG tablet Take 1 tablet (20 mg total) by mouth daily.  30 tablet  11  . [DISCONTINUED] albuterol (PROVENTIL HFA;VENTOLIN HFA) 108 (90 BASE) MCG/ACT inhaler Inhale 2 puffs into the lungs every 4 (four) hours as needed.  1 Inhaler  11  . [DISCONTINUED] budesonide-formoterol (SYMBICORT) 160-4.5 MCG/ACT inhaler Inhale 2 puffs into the lungs 2 (two) times daily.  1 Inhaler  11   No facility-administered encounter medications on file as of 06/25/2012.    No Known Allergies   Current Medications, Allergies, Past Medical History, Past Surgical History, Family History, and Social History were reviewed in [employer] record.    Review of Systems      The patient notes some nasal congestion, drainage, cough, intermittent wheezing, left inguinal area burning discomfort, etc... The patient denies fever, chills, sweats, anorexia, fatigue, weakness, malaise, weight loss, blurring, diplopia, eye irritation,  eye discharge, vision loss, eye pain, photophobia, earache, ear discharge, tinnitus, decreased hearing, nosebleeds, sore throat, hoarseness, chest pain, palpitations, syncope, dyspnea on exertion, orthopnea, PND, peripheral edema, dyspnea at rest, excessive sputum, hemoptysis, pleurisy, nausea, vomiting, diarrhea, constipation, change in bowel habits, abdominal pain, melena, hematochezia, jaundice, gas/bloating, indigestion/heartburn, dysphagia, odynophagia, dysuria, hematuria, urinary frequency, urinary hesitancy, nocturia, incontinence, back pain, joint pain, joint swelling, muscle cramps, muscle weakness, stiffness, arthritis, sciatica, restless legs, leg pain at night, leg pain with exertion, rash, itching, dryness, suspicious lesions, paralysis, paresthesias, seizures, tremors, vertigo, transient blindness, frequent falls, frequent headaches, difficulty walking, depression, anxiety, memory loss, confusion, cold intolerance, heat intolerance, polydipsia, polyphagia, polyuria, unusual weight change, abnormal bruising, bleeding, enlarged lymph nodes, urticaria, allergic rash, hay fever, and recurrent infections.     Objective:   Physical Exam     WD, WN, 62 y/o WM in NAD... GENERAL:  Alert & oriented; pleasant & cooperative. HEENT:  [place]/AT, EOM-wnl, PERRLA, Fundi-benign, EACs-clear, TMs-wnl, NOSE-clear drainage, THROAT-clear & wnl. NECK:  Supple w/ full ROM; no JVD; normal carotid impulses w/o bruits; no thyromegaly or nodules palpated; no lymphadenopathy. CHEST:  Clear to P & A; without wheezes/ rales/ or rhonchi. HEART:  Regular Rhythm; without murmurs/ rubs/ or gallops. ABDOMEN:  Soft & nontender; normal bowel sounds; no organomegaly or masses detected. Left inguinal area appears normal & is nontender to palpation etc... RECTAL:  Neg - prostate 2+ & nontender w/o nodules; stool hematest neg. EXT: without deformities or arthritic changes; no varicose veins/ venous insuffic/ or edema. NEURO:  CN's  intact; motor testing normal; sensory testing normal; gait normal & balance OK. DERM:  No lesions noted; no rash etc...  RADIOLOGY DATA:  Reviewed in the EPIC EMR & discussed w/ the patient...  LABORATORY DATA:  Reviewed in the EPIC EMR & discussed w/ the patient...   Assessment:      R/O OSA>  His sleep study revealed an AHI=4.5 only but lots of loud snoring & transient decr sat to 87%; fiancee is affected by the snoring; He had sleep consult w/ DrClance & they felt it was not a health issue & he decided to work on wt reduction  first...  Allergic Rhinitis>  IgE was 61 and RAST +Dog among others;  He is not inclined to do further allergy testing at this time, but must work harder at control of the dander, keep  dog out of bedroom, set up air cleaner in bedroom, etc...  NOTE> all symptoms resolved when dog removed from the house...  Asthmatic Bronchitis>  Off Symbicort now and rarely needs the Proair now...  HYPERTENSION>  On LOSARTAN 100mg /d w/ good control of BP...  GERD>  He denies reflux or nocturnal problems; continue Prilosec20 qd taken 30 min before a meal...  IBS>  Stable & denies prob w/ BM;  Up to date on colon screen w/ f/u due 2015...  Right Inguinal Pain>  Pain resolved on the Lyrica & he has weaned off...  Hx recurrent boils>  He has MSSA in 2011, no problem since then...     Plan:     Patient's Medications  New Prescriptions   No medications on file  Previous Medications   ASPIRIN 81 MG TABLET    Take 81 mg by mouth daily.     LOSARTAN (COZAAR) 100 MG TABLET    Take 1 tablet (100 mg total) by mouth daily.   OMEPRAZOLE (PRILOSEC OTC) 20 MG TABLET    Take 1 tablet (20 mg total) by mouth daily.  Modified Medications   Modified Medication Previous Medication   ALBUTEROL (PROVENTIL HFA;VENTOLIN HFA) 108 (90 BASE) MCG/ACT INHALER albuterol (PROVENTIL HFA;VENTOLIN HFA) 108 (90 BASE) MCG/ACT inhaler      Inhale 2 puffs into the lungs every 4 (four) hours as needed.     Inhale 2 puffs into the lungs every 4 (four) hours as needed.  Discontinued Medications   BUDESONIDE-FORMOTEROL (SYMBICORT) 160-4.5 MCG/ACT INHALER    Inhale 2 puffs into the lungs 2 (two) times daily.

## 2012-06-25 NOTE — Patient Instructions (Addendum)
Today we updated your med list in our EPIC system...    Continue your current medications the same...    We refilled your meds per request...  Call for any problems or if we can be of service in any way...  Let's plan a Physical w/ CXR, EKG, & FASTING blood work by end of the yr.Marland KitchenMarland Kitchen

## 2013-01-31 ENCOUNTER — Encounter: Payer: Self-pay | Admitting: Adult Health

## 2013-01-31 ENCOUNTER — Ambulatory Visit (INDEPENDENT_AMBULATORY_CARE_PROVIDER_SITE_OTHER): Payer: Managed Care, Other (non HMO) | Admitting: Adult Health

## 2013-01-31 ENCOUNTER — Other Ambulatory Visit (INDEPENDENT_AMBULATORY_CARE_PROVIDER_SITE_OTHER): Payer: Managed Care, Other (non HMO)

## 2013-01-31 VITALS — BP 156/92 | HR 65 | Temp 97.1°F | Ht 72.0 in | Wt 199.4 lb

## 2013-01-31 DIAGNOSIS — M542 Cervicalgia: Secondary | ICD-10-CM

## 2013-01-31 LAB — TSH: TSH: 1.86 u[IU]/mL (ref 0.35–5.50)

## 2013-01-31 NOTE — Assessment & Plan Note (Signed)
Right sided anterior cervical tenderness, question the etiology Exam with possible right-sided thyromegaly, versus adenopathy  Plan Check TSH today. Set up for  Thyroid ultrasound Patient to followup for physical in 2 months with Dr.Nadel

## 2013-01-31 NOTE — Progress Notes (Signed)
Subjective:     Patient ID: Trevor Watson, male   DOB: 1950/11/19, 62 y.o.   MRN: [id number]  HPI 62 y/o WM here for a follow up visit...    01/31/2013 Acute OV  SN pt. Pt reports about 10 days ago he woke up in the middle of night and it felt like he had pulled a muscle at the front of throat. If he turns his head certain ways he can feel the pain. Pt is concerned bc his father was dx with thyroid cancer 15 years ago just by a routine check up.  Patient denies any chest pain, orthopnea, PND, leg swelling, heat or cold intolerances, sinus drainage, cough, congestion, rash, or shortness, of breath. Says along the right side of his throat feels somewhat sore to touch. No wt loss or gain. No arm weakness or radicular pain, no known injury . Took advil x  .          Problem List:   R/O OSA >> 12/12 he mentioned that his girlfriend c/o his snoring & notes that he stops breathing as well;  Pt denies sleep prob & says he wakes refreshed, no daytime hypersomnolence or sleep pressure;  We discussed the need for Sleep Study & he will sched at his convenience ==> done 05/15/11 w/ AHI=4.5/hr, loud snoring & transient desat to 87%... Looks like he will need treatment for this & his snoring> we decided to refer to DrClance for sleep consult... ~  Sleep consult by DrClance 3/13> prob upper airway resistance syndrome, he decided to work on wt reduction & forego poss CPAP or dental appliance Rx...  ALLERGIC RHINITIS >> incr allergy symptoms w/ exposure to his girlfriends dog; we discussed checking IgE level (61)& RAST test (+Dog & few others)...  In the meanwhile they will try to control the environmental exposure, use high quality air cleaner in bedroom, & take OTC antihist daily... ~  3/14: allergy symptoms markedly reduced by removing the dog...  Hx of ASTHMATIC BRONCHITIS - he is an ex-smoker, quit >30yrs ago... Now on SYMBICORT 160- 2sp Bid & PROAIR HFA as needed... ~  12/11: presented w/ several week  hx incr SOB, min cough, no sputum, denies f/c/s, no sinus or upper resp symptoms; he treated self w/ OTC Primateen like med & improved; we decided to Rx w/ PROAIR Prn for now... ~  CXR 12/11 showed clear lungs, wnl... ~  PFT 12/11 showed FVC= 4.4 (84%), FEV1= 3.4 (85%), %1sec= 77, mid-flows= 91%pred, O2sat=99%.  ~  12/12: pt presented w/ incr SOB & allergy symptoms since his girlfriend moved in w/ her dog, see above; notes having to use the Proair more often & we discussed trial Symbicort160 2spBid for now... ~  CXR 12/12 showed lungs remain clear, NAD... ~  2/13: he reports MUCH improved w/ the Symbicort; rec continue same med... ~  3/14: he's been off the Symbicort since the dog is gone, using Proair as needed (not often)...  BORDERLINE HYPERTENSION >> noted intermittent BP elev in past; during CPX 12/12 BP was 150/90 & asked to monitor at home for several months; returned 2/13 w/ BP log indicating that ave BP was indeed ~150/90, therefore decision to start treatment w/ LOSARTAN 100mg /d plus diet & exercise... ~  6/13:  BP= 140/82 on the Losartan100 & he remains asymtomatic... ~  3/14:  BP= 132/84 on the Losartan100 & he denies CP, palpit, SOB, edema, etc...  Hx of CHEST PAIN, ATYPICAL (ICD-786.59) - on ASA 81mg /d.Marland KitchenMarland Kitchen  he had Cardiolite 2000 which was neg... ~  EKG 12/11 showed NSR, WNL, NAD... denies CP, angina, palpit, etc... ~  EKG 12/12 showed NSR rate 68, wnl/ NAD...  GERD (ICD-530.81) - on PRILOSEC 20mg /d... notes some occas heartburn, no dysphagia etc...  IRRITABLE BOWEL SYNDROME (ICD-564.1) - occas abd discomfort & alt diarrhea/ constip... had colonoscopy 2005 which was neg x hemorrhoids... f/u planned 77yrs.  Hx of INGUINAL HERNIA (ICD-550.90) - he had left inguinal hernia repaired at age 51, and a right inguinal hernia repair w/ mesh in 2007... he had post-op pain/ neuritis that improved w/ Lyrica, then resolved... ~  12/12: he presents c/o recurrent left inguinal burning pain & he is  concerned; exam w/o recurrent hernia & it sounds like neuropathy; rec repeat trial of LYRICA starting 75mg /d then incr to Bid...  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 advised nasal saline spray.   Past Surgical History  Procedure Laterality Date  . Laparoscopic cholecystectomy  1996  . Left inguinal hernia repair  age 42  . Right inguinl hernia repair w/ mesh  09/07    Dr. Janee Morn    Outpatient Encounter Prescriptions as of 01/31/2013  Medication Sig Dispense Refill  . albuterol (PROVENTIL HFA;VENTOLIN HFA) 108 (90 BASE) MCG/ACT inhaler Inhale 2 puffs into the lungs every 4 (four) hours as needed.  3 Inhaler  3  . aspirin 81 MG tablet Take 81 mg by mouth daily.        Marland Kitchen losartan (COZAAR) 100 MG tablet Take 1 tablet (100 mg total) by mouth daily.  90 tablet  3  . omeprazole (PRILOSEC OTC) 20 MG tablet Take 1 tablet (20 mg total) by mouth daily.  30 tablet  11   No facility-administered encounter medications on file as of 01/31/2013.    No Known Allergies   Current Medications, Allergies, Past Medical History, Past Surgical History, Family History, and Social History were reviewed in [employer] record.    Review of Systems      The patient notes some nasal congestion, drainage, cough, intermittent wheezing, left inguinal area burning discomfort, etc... The patient denies fever, chills, sweats, anorexia, fatigue, weakness, malaise, weight loss, blurring, diplopia, eye irritation, eye discharge, vision loss, eye pain, photophobia, earache, ear discharge, tinnitus, decreased hearing, nosebleeds, sore throat, hoarseness, chest pain, palpitations, syncope, dyspnea on exertion, orthopnea, PND, peripheral edema, dyspnea at rest, excessive sputum, hemoptysis, pleurisy, nausea, vomiting, diarrhea, constipation, change in bowel habits, abdominal pain, melena, hematochezia,  jaundice, gas/bloating, indigestion/heartburn, dysphagia, odynophagia, dysuria, hematuria, urinary frequency, urinary hesitancy, nocturia, incontinence, back pain, joint pain, joint swelling, muscle cramps, muscle weakness, stiffness, arthritis, sciatica, restless legs, leg pain at night, leg pain with exertion, rash, itching, dryness, suspicious lesions, paralysis, paresthesias, seizures, tremors, vertigo, transient blindness, frequent falls, frequent headaches, difficulty walking, depression, anxiety, memory loss, confusion, cold intolerance, heat intolerance, polydipsia, polyphagia, polyuria, unusual weight change, abnormal bruising, bleeding, enlarged lymph nodes, urticaria, allergic rash, hay fever, and recurrent infections.     Objective:   Physical Exam     WD, WN, 62 y/o WM in NAD... GENERAL:  Alert & oriented; pleasant & cooperative. HEENT:  [place]/AT, EOM-wnl, PERRLA, Fundi-benign, EACs-clear, TMs-wnl, NOSE-clear drainage, THROAT-clear & wnl. NECK:  Supple w/ full ROM; no JVD; normal carotid impulses w/o bruits; right sided thyroid prominence vs adenopathy ,  CHEST:  Clear to P & A; without wheezes/  rales/ or rhonchi. HEART:  Regular Rhythm; without murmurs/ rubs/ or gallops. ABDOMEN:  Soft & nontender; normal bowel sounds; no organomegaly or masses detected.  EXT: without deformities or arthritic changes; no varicose veins/ venous insuffic/ or edema. NEURO:   ait normal & balance OK. DERM:  No lesions noted; no rash etc...    Assessment:

## 2013-01-31 NOTE — Patient Instructions (Signed)
We will set you up for an Ultrasound of your Thyroid  Labs today  Follow up Dr. Kriste Basque  In 2 months for physical.  Please contact office for sooner follow up if symptoms do not improve or worsen or seek emergency care

## 2013-02-03 ENCOUNTER — Encounter: Payer: Self-pay | Admitting: Adult Health

## 2013-02-03 ENCOUNTER — Ambulatory Visit
Admission: RE | Admit: 2013-02-03 | Discharge: 2013-02-03 | Disposition: A | Discharge status: Home / Self Care | Payer: Managed Care, Other (non HMO) | Source: Ambulatory Visit | Attending: Adult Health | Admitting: Adult Health

## 2013-02-03 DIAGNOSIS — M542 Cervicalgia: Secondary | ICD-10-CM

## 2013-02-04 NOTE — Progress Notes (Signed)
Quick Note:  Pt aware of results. ______ 

## 2013-02-17 ENCOUNTER — Ambulatory Visit (INDEPENDENT_AMBULATORY_CARE_PROVIDER_SITE_OTHER): Payer: Managed Care, Other (non HMO) | Admitting: Pulmonary Disease

## 2013-02-17 ENCOUNTER — Encounter: Payer: Self-pay | Admitting: Pulmonary Disease

## 2013-02-17 VITALS — BP 150/90 | HR 61 | Temp 98.5°F | Ht 72.0 in | Wt 202.4 lb

## 2013-02-17 DIAGNOSIS — N320 Bladder-neck obstruction: Secondary | ICD-10-CM

## 2013-02-17 DIAGNOSIS — Z23 Encounter for immunization: Secondary | ICD-10-CM

## 2013-02-17 DIAGNOSIS — I10 Essential (primary) hypertension: Secondary | ICD-10-CM

## 2013-02-17 DIAGNOSIS — K219 Gastro-esophageal reflux disease without esophagitis: Secondary | ICD-10-CM

## 2013-02-17 DIAGNOSIS — K589 Irritable bowel syndrome without diarrhea: Secondary | ICD-10-CM

## 2013-02-17 DIAGNOSIS — E041 Nontoxic single thyroid nodule: Secondary | ICD-10-CM

## 2013-02-17 DIAGNOSIS — J45909 Unspecified asthma, uncomplicated: Secondary | ICD-10-CM

## 2013-02-17 DIAGNOSIS — E7800 Pure hypercholesterolemia, unspecified: Secondary | ICD-10-CM

## 2013-02-17 MED ORDER — OMEPRAZOLE MAGNESIUM 20 MG PO TBEC: 20.0000 mg | DELAYED_RELEASE_TABLET | Freq: Every day | ORAL | Status: DC

## 2013-02-17 MED ORDER — ALBUTEROL SULFATE HFA 108 (90 BASE) MCG/ACT IN AERS: 2.0000 | INHALATION_SPRAY | RESPIRATORY_TRACT | Status: DC | PRN

## 2013-02-17 MED ORDER — TADALAFIL 20 MG PO TABS: 20.0000 mg | ORAL_TABLET | Freq: Every day | ORAL | Status: DC | PRN

## 2013-02-17 NOTE — Patient Instructions (Signed)
Today we updated your med list in our EPIC system...    Continue your current medications the same...  We refilled your meds per request...  Please return to our lab at your convenience for your FASTING blood work...  Call for any questions...  Let's plan a follow up visit in 21mo, sooner if needed for problems.Marland KitchenMarland Kitchen

## 2013-02-20 ENCOUNTER — Other Ambulatory Visit (INDEPENDENT_AMBULATORY_CARE_PROVIDER_SITE_OTHER): Payer: Managed Care, Other (non HMO)

## 2013-02-20 DIAGNOSIS — N320 Bladder-neck obstruction: Secondary | ICD-10-CM

## 2013-02-20 DIAGNOSIS — I10 Essential (primary) hypertension: Secondary | ICD-10-CM

## 2013-02-20 DIAGNOSIS — K589 Irritable bowel syndrome without diarrhea: Secondary | ICD-10-CM

## 2013-02-20 DIAGNOSIS — E7800 Pure hypercholesterolemia, unspecified: Secondary | ICD-10-CM

## 2013-02-20 LAB — PSA: PSA: 1.4 ng/mL (ref 0.10–4.00)

## 2013-02-20 LAB — CBC WITH DIFFERENTIAL/PLATELET
Basophils Relative: 0.8 % (ref 0.0–3.0)
Eosinophils Absolute: 0.2 10*3/uL (ref 0.0–0.7)
Eosinophils Relative: 4.6 % (ref 0.0–5.0)
HCT: 43.5 % (ref 39.0–52.0)
Hemoglobin: 14.9 g/dL (ref 13.0–17.0)
Lymphs Abs: 1.4 10*3/uL (ref 0.7–4.0)
Monocytes Relative: 10.3 % (ref 3.0–12.0)
Neutro Abs: 2.7 10*3/uL (ref 1.4–7.7)
Neutrophils Relative %: 54.9 % (ref 43.0–77.0)
Platelets: 214 10*3/uL (ref 150.0–400.0)
RBC: 4.87 Mil/uL (ref 4.22–5.81)
WBC: 4.9 10*3/uL (ref 4.5–10.5)

## 2013-02-20 LAB — BASIC METABOLIC PANEL
BUN: 22 mg/dL (ref 6–23)
CO2: 28 mEq/L (ref 19–32)
Calcium: 9.3 mg/dL (ref 8.4–10.5)
Creatinine, Ser: 1.3 mg/dL (ref 0.4–1.5)
GFR: 59.73 mL/min — ABNORMAL LOW (ref >60.00)
Glucose, Bld: 99 mg/dL (ref 70–99)

## 2013-02-20 LAB — HEPATIC FUNCTION PANEL
AST: 18 U/L (ref 0–37)
Albumin: 4.3 g/dL (ref 3.5–5.2)
Total Protein: 6.8 g/dL (ref 6.0–8.3)

## 2013-02-20 LAB — LIPID PANEL
Cholesterol: 175 mg/dL (ref 0–200)
HDL: 41.4 mg/dL (ref >39.00)
LDL Cholesterol: 108 mg/dL — ABNORMAL HIGH (ref 0–99)
Triglycerides: 126 mg/dL (ref 0.0–149.0)
VLDL: 25.2 mg/dL (ref 0.0–40.0)

## 2013-02-25 ENCOUNTER — Telehealth: Payer: Self-pay | Admitting: Pulmonary Disease

## 2013-02-25 NOTE — Telephone Encounter (Signed)
I spoke with patient about results and he verbalized understanding and had no questions 

## 2013-03-01 NOTE — Progress Notes (Signed)
Subjective:     Patient ID: Trevor Watson, male   DOB: 25-Aug-1950, 62 y.o.   MRN: [id number]  HPI 62 y/o WM here for a follow up visit...    ~  February 13,2013:  5mo ROV> After his physical 12/12 we decided to do several things:    1) Allergy issues w/ fiance's dog> IgE=61 & +RAST to Dog>dust>cat>some molds>ragweed; they will keep dog out of bedroom & use good room size air cleaner...    2) R/O Sleep Apnea +snoring> sleep study 05/15/11 w/ AHI=4.5/hr, loud snoring, mild desat; partner states loud snoring in all positions (he thought just supine); looks like he will need Rx w/ CPAP trial, oral appliance, other options;  We decided to seek consult from DrClance, Sleep Medicine, for his review...    3) He had borderline HBP & asked to monitor at home> ave is 150/90 & he needs antihypertensive medication; we decided to start therapy w/ LOSARTAN 100mg /d...  ~  October 11, 2011:  32mo ROV Trevor Watson has been keeping a log of his BPs at home- Ave 140/80 on the Losartan & tol well; measures 140/82 here today & he denies CP, palpit, SOB, dizzy, syncope, edema...    He had sleep consult w/ DrClance 3/13> they decided on observation for now as it is not a health risk, no daytime hypersomnolence & his wife is not bothered by his snoring... They did agree to keep the dog out of the bedroom but he hasn't as yet got a room sized electrostatic air cleaner...     We reviewed prob list, meds, xrays and labs> see below >>   ~  June 25, 2012:  8-46mo ROV & Trevor Watson is much improved now that wife's dog has been ret to her ex-husb; he has been able to stop the Symbicort & using the Proair as needed (not often); breathing is normal- denies cough, sput, hemoptysis, SOB, etc...  On Losartan100 for BP= 132/84 today & feeling well w/o CP, palpit, dizzy, SOB, edema, etc... Similarly reflux symptoms well controlled on Omeprazole20 and he denies abd pain, dysphagia, n/v, c/d, blood seen etc...  He requests Cialis- done...    We reviewed  prob list, meds, xrays and labs> see below for updates >> refills for 90d to [facility]...  ~  February 17, 2013:  7-86mo ROV & Trevor Watson saw TP 10/14 w/ unusual throat pain, woke him up at night, worse w/ turning his head, worried because dad had medullary thyroid cancer, otherw pt was asymptomatic; they did Thyroid Ultrasound (tiny 5mm nodule in right upper pole, otherw normal) and TSH=1.86; Returns today for general follow up & we reviewed the following medical problems during today's office visit >>     AR/ AB> on Albut HFA prn; breathing is good & no issues since he is no longer exposed to dog dander...    HBP, Hx atypCP> on Losartan100, ASA81; BP= 150/90 & asked to watch BP at home, avoid sodium, get wt down...    Hx throat pain & tiny thyroid nodule on sonar> this occurred 10/14, 5mm right upper pole nodule (consider repeat sonar 44yr); clinically & biochem euthyroid...    GERD, IBS> on Prilosec20; he has good control of symptoms- denies abd pain, dysphagia, n/v, c/d, blood seen...    S/P bilat inguinal hernia repairs and lap chole> see below; no recurrent discomfort...    Trigger finger> [facility], sent to Ortho who told him trigger finger &  considering cortisone shot for this...    Hx boils w/ MSSA> no recent symptoms... We reviewed prob list, meds, xrays and labs> see below for updates >> OK 2014 Flu vaccine today... Requests refill Cialis20... LABS 11/14:  FLP- ok on diet w/ LDL=108;  Chems- wnl;  CBC- wnl;  PSA=1.40           Problem List:   R/O OSA >> 12/12 he mentioned that his girlfriend c/o his snoring & notes that he stops breathing as well;  Pt denies sleep prob & says he wakes refreshed, no daytime hypersomnolence or sleep pressure;  We discussed the need for Sleep Study & he will sched at his convenience ==> done 05/15/11 w/ AHI=4.5/hr, loud snoring & transient desat to 87%... Looks like he will need treatment for this & his snoring> we decided to  refer to DrClance for sleep consult... ~  Sleep consult by DrClance 3/13> prob upper airway resistance syndrome, he decided to work on wt reduction & forego poss CPAP or dental appliance Rx...  ALLERGIC RHINITIS >> incr allergy symptoms w/ exposure to his girlfriends dog; we discussed checking IgE level (61)& RAST test (+Dog & few others)...  In the meanwhile they will try to control the environmental exposure, use high quality air cleaner in bedroom, & take OTC antihist daily... ~  3/14: allergy symptoms markedly reduced by removing the dog...  Hx of ASTHMATIC BRONCHITIS - he is an ex-smoker, quit >67yrs ago... Now on SYMBICORT 160- 2sp Bid & PROAIR HFA as needed... ~  12/11: presented w/ several week hx incr SOB, min cough, no sputum, denies f/c/s, no sinus or upper resp symptoms; he treated self w/ OTC Primateen like med & improved; we decided to Rx w/ PROAIR Prn for now... ~  CXR 12/11 showed clear lungs, wnl... ~  PFT 12/11 showed FVC= 4.4 (84%), FEV1= 3.4 (85%), %1sec= 77, mid-flows= 91%pred, O2sat=99%.  ~  12/12: pt presented w/ incr SOB & allergy symptoms since his girlfriend moved in w/ her dog, see above; notes having to use the Proair more often & we discussed trial Symbicort160 2spBid for now... ~  CXR 12/12 showed lungs remain clear, NAD... ~  2/13: he reports MUCH improved w/ the Symbicort; rec continue same med... ~  3/14: he's been off the Symbicort since the dog is gone, using Proair as needed (not often)... ~  11/14: he remains stable w/o resp exac...  BORDERLINE HYPERTENSION >> noted intermittent BP elev in past; during CPX 12/12 BP was 150/90 & asked to monitor at home for several months; returned 2/13 w/ BP log indicating that ave BP was indeed ~150/90, therefore decision to start treatment w/ LOSARTAN 100mg /d plus diet & exercise... ~  6/13:  BP= 140/82 on the Losartan100 & he remains asymtomatic... ~  3/14:  BP= 132/84 on the Losartan100 & he denies CP, palpit, SOB, edema,  etc... ~  11/14: on Losartan100, ASA81; BP= 150/90 & asked to watch BP at home, avoid sodium, get wt down  Hx of CHEST PAIN, ATYPICAL (ICD-786.59) - on ASA 81mg /d... he had Cardiolite 2000 which was neg... ~  EKG 12/11 showed NSR, WNL, NAD... denies CP, angina, palpit, etc... ~  EKG 12/12 showed NSR rate 68, wnl/ NAD...  THYROID NODULE >> saw TP 10/14 w/ unusual throat pain, woke him up at night, worse w/ turning his head, worried because dad had medullary thyroid cancer, otherw pt was asymptomatic; they did Thyroid Ultrasound (tiny 5mm nodule in right upper pole,  otherw normal) and TSH=1.86; he was reassured & will consider f/u thyroid sonar in 47yr...  GERD (ICD-530.81) - on PRILOSEC 20mg /d... notes some occas heartburn, no dysphagia etc...  IRRITABLE BOWEL SYNDROME (ICD-564.1) - occas abd discomfort & alt diarrhea/ constip... had colonoscopy 2005 which was neg x hemorrhoids... f/u planned 65yrs.  Hx of INGUINAL HERNIA (ICD-550.90) - he had left inguinal hernia repaired at age 18, and a right inguinal hernia repair w/ mesh in 2007... he had post-op pain/ neuritis that improved w/ Lyrica, then resolved... ~  12/12: he presents c/o recurrent left inguinal burning pain & he is concerned; exam w/o recurrent hernia & it sounds like neuropathy; rec repeat trial of LYRICA starting 75mg /d then incr to Bid...  ORTHO >> trigger finger eval by [facility] Knife & he is considering a shot...  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 advised nasal saline spray.   Past Surgical History  Procedure Laterality Date  . Laparoscopic cholecystectomy  1996  . Left inguinal hernia repair  age 67  . Right inguinl hernia repair w/ mesh  09/07    Dr. Janee Morn    Outpatient Encounter Prescriptions as of 02/17/2013  Medication Sig  . albuterol (PROVENTIL HFA;VENTOLIN HFA) 108 (90 BASE) MCG/ACT inhaler Inhale 2 puffs into the  lungs every 4 (four) hours as needed.  Marland Kitchen aspirin 81 MG tablet Take 81 mg by mouth daily.    Marland Kitchen losartan (COZAAR) 100 MG tablet Take 1 tablet (100 mg total) by mouth daily.  Marland Kitchen omeprazole (PRILOSEC OTC) 20 MG tablet Take 1 tablet (20 mg total) by mouth daily.  . [DISCONTINUED] albuterol (PROVENTIL HFA;VENTOLIN HFA) 108 (90 BASE) MCG/ACT inhaler Inhale 2 puffs into the lungs every 4 (four) hours as needed.  . [DISCONTINUED] omeprazole (PRILOSEC OTC) 20 MG tablet Take 1 tablet (20 mg total) by mouth daily.  . tadalafil (CIALIS) 20 MG tablet Take 1 tablet (20 mg total) by mouth daily as needed for erectile dysfunction.    No Known Allergies   Current Medications, Allergies, Past Medical History, Past Surgical History, Family History, and Social History were reviewed in [employer] record.    Review of Systems      The patient notes some nasal congestion, drainage, cough, intermittent wheezing, left inguinal area burning discomfort, etc... The patient denies fever, chills, sweats, anorexia, fatigue, weakness, malaise, weight loss, blurring, diplopia, eye irritation, eye discharge, vision loss, eye pain, photophobia, earache, ear discharge, tinnitus, decreased hearing, nosebleeds, sore throat, hoarseness, chest pain, palpitations, syncope, dyspnea on exertion, orthopnea, PND, peripheral edema, dyspnea at rest, excessive sputum, hemoptysis, pleurisy, nausea, vomiting, diarrhea, constipation, change in bowel habits, abdominal pain, melena, hematochezia, jaundice, gas/bloating, indigestion/heartburn, dysphagia, odynophagia, dysuria, hematuria, urinary frequency, urinary hesitancy, nocturia, incontinence, back pain, joint pain, joint swelling, muscle cramps, muscle weakness, stiffness, arthritis, sciatica, restless legs, leg pain at night, leg pain with exertion, rash, itching, dryness, suspicious lesions, paralysis, paresthesias, seizures, tremors, vertigo, transient blindness, frequent  falls, frequent headaches, difficulty walking, depression, anxiety, memory loss, confusion, cold intolerance, heat intolerance, polydipsia, polyphagia, polyuria, unusual weight change, abnormal bruising, bleeding, enlarged lymph nodes, urticaria, allergic rash, hay fever, and recurrent infections.     Objective:   Physical Exam     WD, WN, 62 y/o WM in NAD... GENERAL:  Alert & oriented; pleasant & cooperative. HEENT:  [place]/AT, EOM-wnl, PERRLA, Fundi-benign, EACs-clear, TMs-wnl, NOSE-clear drainage, THROAT-clear &  wnl. NECK:  Supple w/ full ROM; no JVD; normal carotid impulses w/o bruits; no thyromegaly or nodules palpated; no lymphadenopathy. CHEST:  Clear to P & A; without wheezes/ rales/ or rhonchi. HEART:  Regular Rhythm; without murmurs/ rubs/ or gallops. ABDOMEN:  Soft & nontender; normal bowel sounds; no organomegaly or masses detected. Left inguinal area appears normal & is nontender to palpation etc... RECTAL:  Neg - prostate 2+ & nontender w/o nodules; stool hematest neg. EXT: without deformities or arthritic changes; no varicose veins/ venous insuffic/ or edema. NEURO:  CN's intact; motor testing normal; sensory testing normal; gait normal & balance OK. DERM:  No lesions noted; no rash etc...  RADIOLOGY DATA:  Reviewed in the EPIC EMR & discussed w/ the patient...  LABORATORY DATA:  Reviewed in the EPIC EMR & discussed w/ the patient...   Assessment:      Tiny thyroid nodule on sonar> we will follow clinically & consider repeat ultrasound in 1 yr...   R/O OSA>  His sleep study revealed an AHI=4.5 only but lots of loud snoring & transient decr sat to 87%; fiancee is affected by the snoring; He had sleep consult w/ DrClance & they felt it was not a health issue & he decided to work on wt reduction first...  Allergic Rhinitis>  IgE was 61 and RAST +Dog among others;  He is not inclined to do further allergy testing at this time, but must work harder at control of the dander, keep   dog out of bedroom, set up air cleaner in bedroom, etc...  NOTE> all symptoms resolved when dog removed from the house...  Asthmatic Bronchitis>  Off Symbicort now and rarely needs the Proair now...  HYPERTENSION>  On LOSARTAN 100mg /d w/ adeq control of BP, asked to elim salt & get wt down...  GERD>  He denies reflux or nocturnal problems; continue Prilosec20 qd taken 30 min before a meal...  IBS>  Stable & denies prob w/ BM;  Up to date on colon screen w/ f/u due 2015...  Right Inguinal Pain>  Pain resolved on the Lyrica & he has weaned off...  Hx recurrent boils>  He has MSSA in 2011, no problem since then...     Plan:     Patient's Medications  New Prescriptions   TADALAFIL (CIALIS) 20 MG TABLET    Take 1 tablet (20 mg total) by mouth daily as needed for erectile dysfunction.  Previous Medications   ASPIRIN 81 MG TABLET    Take 81 mg by mouth daily.     LOSARTAN (COZAAR) 100 MG TABLET    Take 1 tablet (100 mg total) by mouth daily.  Modified Medications   Modified Medication Previous Medication   ALBUTEROL (PROVENTIL HFA;VENTOLIN HFA) 108 (90 BASE) MCG/ACT INHALER albuterol (PROVENTIL HFA;VENTOLIN HFA) 108 (90 BASE) MCG/ACT inhaler      Inhale 2 puffs into the lungs every 4 (four) hours as needed.    Inhale 2 puffs into the lungs every 4 (four) hours as needed.   OMEPRAZOLE (PRILOSEC OTC) 20 MG TABLET omeprazole (PRILOSEC OTC) 20 MG tablet      Take 1 tablet (20 mg total) by mouth daily.    Take 1 tablet (20 mg total) by mouth daily.  Discontinued Medications   No medications on file

## 2013-04-09 ENCOUNTER — Ambulatory Visit: Payer: Managed Care, Other (non HMO) | Admitting: Pulmonary Disease

## 2013-06-23 ENCOUNTER — Other Ambulatory Visit: Payer: Self-pay | Admitting: Pulmonary Disease

## 2013-06-26 ENCOUNTER — Telehealth: Payer: Self-pay | Admitting: Pulmonary Disease

## 2013-06-26 NOTE — Telephone Encounter (Signed)
Called and spoke with pt and he is aware of SN retirement from primary care.  He will research his insurance and will call me back to let me know who he would like to be set up with.  Nothing further is needed.

## 2013-08-06 ENCOUNTER — Encounter: Payer: Self-pay | Admitting: Internal Medicine

## 2013-08-18 ENCOUNTER — Ambulatory Visit: Payer: Managed Care, Other (non HMO) | Admitting: Pulmonary Disease

## 2013-11-18 LAB — BASIC METABOLIC PANEL: Glucose: 118 mg/dL

## 2013-11-18 LAB — LIPID PANEL
CHOLESTEROL: 136 mg/dL (ref 0–200)
HDL: 34 mg/dL — AB (ref 35–70)

## 2013-12-03 ENCOUNTER — Ambulatory Visit (INDEPENDENT_AMBULATORY_CARE_PROVIDER_SITE_OTHER): Payer: Managed Care, Other (non HMO) | Admitting: Family Medicine

## 2013-12-03 ENCOUNTER — Encounter: Payer: Self-pay | Admitting: Family Medicine

## 2013-12-03 VITALS — BP 149/84 | HR 71 | Ht 71.0 in | Wt 197.0 lb

## 2013-12-03 DIAGNOSIS — L0293 Carbuncle, unspecified: Secondary | ICD-10-CM | POA: Insufficient documentation

## 2013-12-03 DIAGNOSIS — I10 Essential (primary) hypertension: Secondary | ICD-10-CM

## 2013-12-03 DIAGNOSIS — M19041 Primary osteoarthritis, right hand: Secondary | ICD-10-CM

## 2013-12-03 DIAGNOSIS — E041 Nontoxic single thyroid nodule: Secondary | ICD-10-CM

## 2013-12-03 DIAGNOSIS — R251 Tremor, unspecified: Secondary | ICD-10-CM

## 2013-12-03 DIAGNOSIS — R259 Unspecified abnormal involuntary movements: Secondary | ICD-10-CM

## 2013-12-03 DIAGNOSIS — L0292 Furuncle, unspecified: Secondary | ICD-10-CM

## 2013-12-03 DIAGNOSIS — H01006 Unspecified blepharitis left eye, unspecified eyelid: Secondary | ICD-10-CM

## 2013-12-03 DIAGNOSIS — K219 Gastro-esophageal reflux disease without esophagitis: Secondary | ICD-10-CM

## 2013-12-03 DIAGNOSIS — Z1211 Encounter for screening for malignant neoplasm of colon: Secondary | ICD-10-CM

## 2013-12-03 DIAGNOSIS — M19049 Primary osteoarthritis, unspecified hand: Secondary | ICD-10-CM | POA: Insufficient documentation

## 2013-12-03 DIAGNOSIS — H01009 Unspecified blepharitis unspecified eye, unspecified eyelid: Secondary | ICD-10-CM

## 2013-12-03 DIAGNOSIS — R739 Hyperglycemia, unspecified: Secondary | ICD-10-CM

## 2013-12-03 DIAGNOSIS — R7309 Other abnormal glucose: Secondary | ICD-10-CM

## 2013-12-03 LAB — HEMOGLOBIN A1C
Hgb A1c MFr Bld: 5.6 % (ref <5.7)
MEAN PLASMA GLUCOSE: 114 mg/dL (ref <117)

## 2013-12-03 MED ORDER — DICLOFENAC SODIUM 1 % TD GEL: 2.0000 g | Freq: Four times a day (QID) | TRANSDERMAL | Status: DC

## 2013-12-03 MED ORDER — CHLORHEXIDINE GLUCONATE 4 % EX SOLN: CUTANEOUS | Status: DC

## 2013-12-03 NOTE — Progress Notes (Signed)
CC: Trevor Watson is a 63 y.o. male is here for Establish Care   Subjective: HPI:  Very pleasant 63 year old here to establish care  Chart review reveals a history of a thyroid nodule diagnosed in the winter of last year. This occurred in the setting of anterior neck pain which is no longer present. Denies unintentional weight loss or gain and TSH has been normal in the past. Denies dysphasia or difficulty swallowing  History of GERD currently taking omeprazole on a daily basis. Denies any abdominal pain or reflux symptoms provided he takes this on a daily basis.  He brings in blood work from her recent health screening at his employer's office which reveals a fasting glucose of 113. He denies any remote history of hyperglycemia. Denies polyuria polyphasia polydipsia but does have a history of poorly healing wounds described as boils none of which are currently present  Get a colonoscopy a little over 10 years ago that showed hemorrhoids but no other abnormality specifically no polyps. He's not had a followup as of yet.  Complains of recurrent boils over the past 3-4 years. It can occur anywhere on the body. He had one about a month ago which required incision and drainage. Interventions have only included this and taking antibiotics on an as-needed basis. Currently he denies any fevers, chills, nor any skin lesions  Complains of a tremor that has been present for the past one to 2 years localized in the left hand that he only notices while writing with his dominant left hand. He believes that his writing has not gotten smaller rather it's gotten much more sloppy. It's mildly interfering with his quality of life. He has a father with Parkinson's disease and is worried this could be a sign that he has Parkinson's himself. He denies any resting tremor, falls, nor stiffness recently or remotely.  Denies difficulty feeding himself. No interventions as of yet  Patient with chronic bilateral hand  pain but mostly localized to the third MCP joint of the right hand. Is been present for one to 2 years, get an x-ray revealing mild degenerative changes in the phalanges and at the MCP joints that was obtained last year. Pain is described as a stiffness, rigidity, worse with sudden phalanges extension and localized deep in the joint.  It is moderately improved with taking Aleve twice a day which has been taking daily for many months now. Denies any redness or warmth but does endorse chronic swelling at the MCP joint described above.  History of essential hypertension he's been taking losartan for what he believes is over 10 years now. He brings in outside blood pressures revealing stage I hypertension on initial check but then pre-hypertensive range after sitting for a few minutes when this was checked a few weeks ago.  Review of Systems - General ROS: negative for - chills, fever, night sweats, weight gain or weight loss Ophthalmic ROS: negative for - decreased vision Psychological ROS: negative for - anxiety or depression ENT ROS: negative for - hearing change, nasal congestion, tinnitus or allergies Hematological and Lymphatic ROS: negative for - bleeding problems, bruising or swollen lymph nodes Breast ROS: negative Respiratory ROS: no cough, shortness of breath, or wheezing Cardiovascular ROS: no chest pain or dyspnea on exertion Gastrointestinal ROS: no abdominal pain, change in bowel habits, or black or bloody stools Genito-Urinary ROS: negative for - genital discharge, genital ulcers, incontinence or abnormal bleeding from genitals Musculoskeletal ROS: negative for - joint pain or muscle pain other  than that described above Neurological ROS: negative for - headaches or memory loss Dermatological ROS: Currently negative for lumps, mole changes, rash and skin lesion changes  Past Medical History  Diagnosis Date  . Bronchitis     recurrent  . GERD (gastroesophageal reflux disease)   . IBS  (irritable bowel syndrome)   . History of inguinal hernia repair   . Recurrent boils   . History of chest pain     Past Surgical History  Procedure Laterality Date  . Laparoscopic cholecystectomy  1996  . Left inguinal hernia repair  age 83  . Right inguinl hernia repair w/ mesh  09/07    Dr. Janee Morn   Family History  Problem Relation Age of Onset  . Breast cancer Mother   . Thyroid cancer Father   . Hypertension Father   . Parkinson's disease Father     History   Social History  . Marital Status: Married    Spouse Name: [name]    Number of Children: 3  . Years of Education: N/A   Occupational History  . accountant    Social History Main Topics  . Smoking status: Former Smoker -- 2.00 packs/day for 30 years    Types: Cigarettes    Quit date: 04/17/2001  . Smokeless tobacco: Never Used     Comment: exposed to second hand smoke  . Alcohol Use: Yes     Comment: 1-2 daily  . Drug Use: No  . Sexual Activity: Not on file   Other Topics Concern  . Not on file   Social History Narrative   Engaged to be married to [name]     Objective: BP 149/84  Pulse 71  Ht 5\' 11"  (1.803 m)  Wt 197 lb (89.359 kg)  BMI 27.49 kg/m2  General: Alert and Oriented, No Acute Distress HEENT: Pupils equal, round, reactive to light. Conjunctivae clear however he does have some mild blepharitis on the left lower eyelid.  External ears unremarkable, canals clear with intact TMs with appropriate landmarks.  Middle ear appears open without effusion. Pink inferior turbinates.  Moist mucous membranes, pharynx without inflammation nor lesions.  Neck supple without palpable lymphadenopathy nor abnormal masses. Lungs: Clear and comfortable work of breathing Cardiac: Regular rate and rhythm. Neuro: Cranial nerves II through XII grossly intact. No cogwheeling in the upper extremities, no resting tremor, finger-nose testing shows tremor approaching either my finger his  nose. Extremities: No peripheral edema.  Strong peripheral pulses. Full range of motion strength in the right hand however there is moderate bony swelling of the MCP joint tender to the touch Mental Status: No depression, anxiety, nor agitation. Skin: Warm and dry.  Assessment & Plan: Cuyler was seen today for establish care.  Diagnoses and associated orders for this visit:  Thyroid nodule  Gastroesophageal reflux disease without esophagitis  Hyperglycemia - Hemoglobin A1c  Colon cancer screening - Ambulatory referral to Gastroenterology  Recurrent boils - Chlorhexidine Gluconate 4 % SOLN; Use as body wash once a week to prevent boils.  Tremor  Osteoarthritis of right hand, unspecified osteoarthritis type - diclofenac sodium (VOLTAREN) 1 % GEL; Apply 2 g topically 4 (four) times daily.  Essential hypertension  Blepharitis, left    Thyroid nodule: Stable Will obtain repeat ultrasound on or after October GERD: Stable continue omeprazole Hyperglycemia: Check an A1c today Colon cancer screening: Overdue for colonoscopy a referral has been placed Recurrent boils: Discussed colonization of MRSA and he can decrease his overall low  by taking weekly chlorhexidine scrubs in the shower Tremor: Reassurance provided that this and his exam is not consistent with Parkinson's at this time, we will follow this clinically until it is significantly interfering with quality of life. I've asked him to notice whether or not his tremor changes to any degree after drinking one to 2 glasses of alcoholic beverage Osteo- arthritis of the right hand: Start diclofenac topical, avoid nonsteroidal anti-inflammatories Essential hypertension: Borderline controlled, stop nonsteroidal anti-inflammatories continue losartan if no improvement at 3 month followup will adjust antihypertensive regimen Blepharitis: Discussed using warm compresses with dilute baby shampoo to the eyelid 3 times a day for the next  week.  60 minutes spent face-to-face during visit today of which at least 50% was counseling or coordinating care regarding: 1. Thyroid nodule   2. Gastroesophageal reflux disease without esophagitis   3. Hyperglycemia   4. Colon cancer screening   5. Recurrent boils   6. Tremor   7. Osteoarthritis of right hand, unspecified osteoarthritis type   8. Essential hypertension   9. Blepharitis, left       Return in about 3 months (around 03/05/2014).

## 2013-12-11 ENCOUNTER — Encounter: Payer: Self-pay | Admitting: Family Medicine

## 2013-12-29 ENCOUNTER — Encounter: Payer: Self-pay | Admitting: Family Medicine

## 2014-01-02 ENCOUNTER — Encounter: Payer: Self-pay | Admitting: Internal Medicine

## 2014-01-19 ENCOUNTER — Encounter: Payer: Self-pay | Admitting: Family Medicine

## 2014-01-22 ENCOUNTER — Encounter: Payer: Self-pay | Admitting: Internal Medicine

## 2014-03-05 ENCOUNTER — Ambulatory Visit (INDEPENDENT_AMBULATORY_CARE_PROVIDER_SITE_OTHER): Payer: Managed Care, Other (non HMO) | Admitting: Family Medicine

## 2014-03-05 ENCOUNTER — Encounter: Payer: Self-pay | Admitting: Family Medicine

## 2014-03-05 VITALS — BP 151/85 | HR 70 | Ht 71.0 in | Wt 199.0 lb

## 2014-03-05 DIAGNOSIS — I10 Essential (primary) hypertension: Secondary | ICD-10-CM

## 2014-03-05 DIAGNOSIS — H00016 Hordeolum externum left eye, unspecified eyelid: Secondary | ICD-10-CM

## 2014-03-05 DIAGNOSIS — R739 Hyperglycemia, unspecified: Secondary | ICD-10-CM

## 2014-03-05 DIAGNOSIS — E041 Nontoxic single thyroid nodule: Secondary | ICD-10-CM

## 2014-03-05 MED ORDER — LOSARTAN POTASSIUM 100 MG PO TABS: 100.0000 mg | ORAL_TABLET | Freq: Every day | ORAL | Status: DC

## 2014-03-05 MED ORDER — POLYMYXIN B-TRIMETHOPRIM 10000-0.1 UNIT/ML-% OP SOLN: 2.0000 [drp] | OPHTHALMIC | Status: DC

## 2014-03-05 NOTE — Progress Notes (Signed)
CC: Trevor Watson is a 63 y.o. male is here for Follow-up   Subjective: HPI:  Follow-up thyroid nodule: He denies any swelling or lump in his throat, neck. He denies any difficulty swallowing. There's been no unintentional weight loss. He has a father who had thyroid cancer requiring thyroidectomy. Patient had a thyroid nodule found last year in October.  Follow-up hyperglycemia: There has been no polyuria or polyphasia or polydipsia since I saw him last. A1c 3 months ago was normal.   follow-up essential hypertension: He brings in blood pressure values from home reflect inconsistent normotensive readings while taking losartan 100 mg daily. Denies chest pain shortness of breath orthopnea nor peripheral edema    complains of left eye irritation that comes and goes on a weekly basis for the past months. Nothing particularly makes it better or worse. Warm compresses and Johnson's baby shampoo did not help much. Describes it as irritation with blinking and redness of the lateral eye. Denies any vision loss. There's been no fevers, chills or headaches.   review Of Systems Outlined In HPI  Past Medical History  Diagnosis Date  . Bronchitis     recurrent  . GERD (gastroesophageal reflux disease)   . IBS (irritable bowel syndrome)   . History of inguinal hernia repair   . Recurrent boils   . History of chest pain     Past Surgical History  Procedure Laterality Date  . Laparoscopic cholecystectomy  1996  . Left inguinal hernia repair  age 89  . Right inguinl hernia repair w/ mesh  09/07    Dr. Janee Mornhompson   Family History  Problem Relation Age of Onset  . Breast cancer Mother   . Thyroid cancer Father   . Hypertension Father   . Parkinson's disease Father     History   Social History  . Marital Status: Married    Spouse Name: Renato GailsDeane    Number of Children: 3  . Years of Education: N/A   Occupational History  . accountant    Social History Main Topics  . Smoking status:  Former Smoker -- 2.00 packs/day for 30 years    Types: Cigarettes    Quit date: 04/17/2001  . Smokeless tobacco: Never Used     Comment: exposed to second hand smoke  . Alcohol Use: Yes     Comment: 1-2 daily  . Drug Use: No  . Sexual Activity: Not on file   Other Topics Concern  . Not on file   Social History Narrative   Engaged to be married to [employer] Spring/Summer 2013     Objective: BP 151/85 mmHg  Pulse 70  Ht 5\' 11"  (1.803 m)  Wt 199 lb (90.266 kg)  BMI 27.77 kg/m2  General: Alert and Oriented, No Acute Distress HEENT: Pupils equal, round, reactive to light.  right Conjunctivae clear.   left peripheral lateral conjunctivitis improving as it approaches the limbus. Medial aspect of the inferior lid is moderately erythematous with a small raised papule.  Moist mucous membranes, pharynx without inflammation nor lesions.  Neck supple without palpable lymphadenopathy nor abnormal masses. Lungs: Clear to auscultation bilaterally, no wheezing/ronchi/rales.  Comfortable work of breathing. Good air movement. Cardiac: Regular rate and rhythm. Normal S1/S2.  No murmurs, rubs, nor gallops.   Extremities: No peripheral edema.  Strong peripheral pulses.  Mental Status: No depression, anxiety, nor agitation. Skin: Warm and dry.  Assessment & Plan: [name] was seen today for follow-up.  Diagnoses and associated orders for  this visit:  Thyroid nodule - US Soft Tissue Head/Neck; Future  Hyperglycemia  Essential hypertension - losartan (COZAAR) 100 MG tablet; Take 1 tablet (100 mg total) by mouth daily.  Stye external, left - trimethoprim-polymyxin b (POLYTRIM) ophthalmic solution; Place 2 drops into the left eye every 4 (four) hours. For seven days.     thyroid nodule: Repeat ultrasound has been ordered Avastin to call me if he is not notified by next week in regards to scheduling this   hyperglycemia: Clinically controlled no further intervention Essential hypertension:  Controlled, currently approaching this as white coat hypertension therefore continue losartan Stye of the left eye: Start Polytrim for 1 week.  Return in about 3 months (around 06/05/2014) for BP F/U.

## 2014-03-16 ENCOUNTER — Ambulatory Visit (AMBULATORY_SURGERY_CENTER): Payer: Self-pay | Admitting: *Deleted

## 2014-03-16 VITALS — Ht 71.0 in | Wt 203.0 lb

## 2014-03-16 DIAGNOSIS — Z1211 Encounter for screening for malignant neoplasm of colon: Secondary | ICD-10-CM

## 2014-03-16 DIAGNOSIS — J45909 Unspecified asthma, uncomplicated: Secondary | ICD-10-CM | POA: Insufficient documentation

## 2014-03-16 DIAGNOSIS — K219 Gastro-esophageal reflux disease without esophagitis: Secondary | ICD-10-CM | POA: Insufficient documentation

## 2014-03-16 NOTE — Progress Notes (Signed)
Patient denies any allergies to eggs or soy. Patient denies any problems with anesthesia/sedation. Patient denies any oxygen use at home and does not take any diet/weight loss medications. EMMI education assisgned to patient on colonoscopy, this was explained and instructions given to patient. 

## 2014-03-25 ENCOUNTER — Encounter: Payer: Self-pay | Admitting: Internal Medicine

## 2014-03-25 ENCOUNTER — Ambulatory Visit (AMBULATORY_SURGERY_CENTER): Payer: Managed Care, Other (non HMO) | Admitting: Internal Medicine

## 2014-03-25 VITALS — BP 114/85 | HR 57 | Temp 96.8°F | Resp 21 | Ht 71.0 in | Wt 203.0 lb

## 2014-03-25 DIAGNOSIS — K5730 Diverticulosis of large intestine without perforation or abscess without bleeding: Secondary | ICD-10-CM

## 2014-03-25 DIAGNOSIS — Z1211 Encounter for screening for malignant neoplasm of colon: Secondary | ICD-10-CM

## 2014-03-25 MED ORDER — SODIUM CHLORIDE 0.9 % IV SOLN: 500.0000 mL | INTRAVENOUS | Status: DC

## 2014-03-25 NOTE — Op Note (Signed)
[facility] [address] [place], [id number]   COLONOSCOPY PROCEDURE REPORT  PATIENT: Trevor Watson, Trevor Watson  MR#: [id number] BIRTHDATE: 1950/11/23 , 63  yrs. old GENDER: male ENDOSCOPIST: Iva Booparl E Gessner, MD, [facility] REFERRED BY: PROCEDURE DATE:  03/25/2014 PROCEDURE:   Colonoscopy, screening First Screening Colonoscopy - Avg.  risk and is 50 yrs.  old or older - No.  Prior Negative Screening - Now for repeat screening. 10 or more years since last screening  History of Adenoma - Now for follow-up colonoscopy & has been > or = to 3 yrs.  N/A  Polyps Removed Today? No.  Polyps Removed Today? No.  Recommend repeat exam, <10 yrs? Polyps Removed Today? No.  Recommend repeat exam, <10 yrs? No. ASA CLASS:   Class II INDICATIONS:average risk for colorectal cancer. MEDICATIONS: Propofol 320 mg IV and Monitored anesthesia care  DESCRIPTION OF PROCEDURE:   After the risks benefits and alternatives of the procedure were thoroughly explained, informed consent was obtained.  The digital rectal exam revealed no abnormalities of the rectum, revealed no prostatic nodules, and revealed the prostate was not enlarged.   The LB [id number] endoscope was introduced through the anus and advanced to the cecum, which was identified by both the appendix and ileocecal valve. No adverse events experienced.   The quality of the prep was excellent, using MiraLax  The instrument was then slowly withdrawn as the colon was fully examined.      COLON FINDINGS: There was diverticulosis noted in the sigmoid colon. The examination was otherwise normal.  Retroflexed rectal and right colon views revealed no abnormalities. The time to cecum=2 minutes 38 seconds.  Withdrawal time=14 minutes 34 seconds.  The scope was withdrawn and the procedure completed. COMPLICATIONS: There were no immediate complications.  ENDOSCOPIC IMPRESSION: 1.   Diverticulosis was noted in the sigmoid colon 2.   The  examination was otherwise normal  RECOMMENDATIONS: Repeat colonoscopy 10 years.  eSigned:  Iva Booparl E Gessner, MD, [facility] 03/25/2014 8:40 AM   cc: Laren BoomSean Hommel, MD and The Patient

## 2014-03-25 NOTE — Patient Instructions (Addendum)
No polyps or cancer seen. You do have diverticulosis - thickened muscle rings and pouches in the colon wall. Please read the handout about this condition.  Next routine colonoscopy in 10 years - 2025  I appreciate the opportunity to care for you. Iva Booparl E. Rodnesha Elie, MD, [facility]  Discharge instructions given. Handout on diverticulosis. Resume previous medications. YOU HAD AN ENDOSCOPIC PROCEDURE TODAY AT THE [facility]: Refer to the procedure report that was given to you for any specific questions about what was found during the examination.  If the procedure report does not answer your questions, please call your gastroenterologist to clarify.  If you requested that your care partner not be given the details of your procedure findings, then the procedure report has been included in a sealed envelope for you to review at your convenience later.  YOU SHOULD EXPECT: Some feelings of bloating in the abdomen. Passage of more gas than usual.  Walking can help get rid of the air that was put into your GI tract during the procedure and reduce the bloating. If you had a lower endoscopy (such as a colonoscopy or flexible sigmoidoscopy) you may notice spotting of blood in your stool or on the toilet paper. If you underwent a bowel prep for your procedure, then you may not have a normal bowel movement for a few days.  DIET: Your first meal following the procedure should be a light meal and then it is ok to progress to your normal diet.  A half-sandwich or bowl of soup is an example of a good first meal.  Heavy or fried foods are harder to digest and may make you feel nauseous or bloated.  Likewise meals heavy in dairy and vegetables can cause extra gas to form and this can also increase the bloating.  Drink plenty of fluids but you should avoid alcoholic beverages for 24 hours.  ACTIVITY: Your care partner should take you home directly after the procedure.  You should plan to take it easy,  moving slowly for the rest of the day.  You can resume normal activity the day after the procedure however you should NOT DRIVE or use heavy machinery for 24 hours (because of the sedation medicines used during the test).    SYMPTOMS TO REPORT IMMEDIATELY: A gastroenterologist can be reached at any hour.  During normal business hours, 8:30 AM to 5:00 PM Monday through Friday, call [phone].  After hours and on weekends, please call the GI answering service at [phone] who will take a message and have the physician on call contact you.   Following lower endoscopy (colonoscopy or flexible sigmoidoscopy):  Excessive amounts of blood in the stool  Significant tenderness or worsening of abdominal pains  Swelling of the abdomen that is new, acute  Fever of 100F or higher  FOLLOW UP: If any biopsies were taken you will be contacted by phone or by letter within the next 1-3 weeks.  Call your gastroenterologist if you have not heard about the biopsies in 3 weeks.  Our staff will call the home number listed on your records the next business day following your procedure to check on you and address any questions or concerns that you may have at that time regarding the information given to you following your procedure. This is a courtesy call and so if there is no answer at the home number and we have not heard from you through the emergency physician on call, we will assume that  you have returned to your regular daily activities without incident.  SIGNATURES/CONFIDENTIALITY: You and/or your care partner have signed paperwork which will be entered into your electronic medical record.  These signatures attest to the fact that that the information above on your After Visit Summary has been reviewed and is understood.  Full responsibility of the confidentiality of this discharge information lies with you and/or your care-partner.

## 2014-03-25 NOTE — Progress Notes (Signed)
A/ox3, pleased with MAC, report to RN 

## 2014-03-26 ENCOUNTER — Telehealth: Payer: Self-pay | Admitting: *Deleted

## 2014-03-26 NOTE — Telephone Encounter (Signed)
Unable to leave message at [phone], voicemail not set up.

## 2014-04-02 ENCOUNTER — Ambulatory Visit (INDEPENDENT_AMBULATORY_CARE_PROVIDER_SITE_OTHER): Payer: Managed Care, Other (non HMO)

## 2014-04-02 DIAGNOSIS — E041 Nontoxic single thyroid nodule: Secondary | ICD-10-CM

## 2014-06-12 ENCOUNTER — Ambulatory Visit (INDEPENDENT_AMBULATORY_CARE_PROVIDER_SITE_OTHER): Payer: Managed Care, Other (non HMO)

## 2014-06-12 ENCOUNTER — Ambulatory Visit (INDEPENDENT_AMBULATORY_CARE_PROVIDER_SITE_OTHER): Payer: Managed Care, Other (non HMO) | Admitting: Family Medicine

## 2014-06-12 ENCOUNTER — Encounter: Payer: Self-pay | Admitting: Family Medicine

## 2014-06-12 VITALS — BP 130/74 | HR 74 | Ht 71.0 in | Wt 201.0 lb

## 2014-06-12 DIAGNOSIS — M25552 Pain in left hip: Secondary | ICD-10-CM

## 2014-06-12 DIAGNOSIS — N529 Male erectile dysfunction, unspecified: Secondary | ICD-10-CM

## 2014-06-12 DIAGNOSIS — I10 Essential (primary) hypertension: Secondary | ICD-10-CM

## 2014-06-12 MED ORDER — SILDENAFIL CITRATE 20 MG PO TABS: ORAL_TABLET | ORAL | Status: DC

## 2014-06-12 NOTE — Progress Notes (Signed)
CC: Trevor Watson is a 64 y.o. male is here for Follow-up   Subjective: HPI:  Follow-up essential hypertension: Has been taking losartan 100 mg on a daily basis outside blood pressures to report no chest pain shortness of breath orthopnea nor peripheral edema. no known side effects to this medication  Complaints of left hip pain that has been present for a few months now. It can happen any day of the week. Symptoms are not reproducible but seemed to be most prevailing when doing twisting motions with the left hip. Symptoms have not been getting better or worse since onset. Described as a sharp pain that is nonradiating but localized in the anterior aspect of the hip. He's tried ibuprofen with no benefit from the pain. It is present only with weightbearing. When it occurs it is severe in severity and will subside without any particular intervention other than waiting a few hours. No interventions as of yet other than that described above  Complaints of difficulty maintaining an erection that has been present for a few years now. It happens during every sexual encounter, he is monogamous with his wife who he is still attracted to. He denies any other genitourinary complaints. He's had benefit from Cialis in the past however it's too expensive.   Review Of Systems Outlined In HPI  Past Medical History  Diagnosis Date  . Bronchitis     recurrent  . GERD (gastroesophageal reflux disease)   . IBS (irritable bowel syndrome)   . History of inguinal hernia repair   . Recurrent boils   . History of chest pain   . Asthma   . Allergy     Past Surgical History  Procedure Laterality Date  . Laparoscopic cholecystectomy  1996  . Left inguinal hernia repair  age 63  . Right inguinl hernia repair w/ mesh  09/07    Trevor Watson   Family History  Problem Relation Age of Onset  . Breast cancer Mother   . Thyroid cancer Father   . Hypertension Father   . Parkinson's disease Father   . Colon  cancer Neg Hx     History   Social History  . Marital Status: Married    Spouse Name: Trevor Watson  . Number of Children: 3  . Years of Education: N/A   Occupational History  . accountant    Social History Main Topics  . Smoking status: Former Smoker -- 2.00 packs/day for 30 years    Types: Cigarettes    Quit date: 04/17/2001  . Smokeless tobacco: Never Used     Comment: exposed to second hand smoke  . Alcohol Use: 3.6 oz/week    6 Cans of beer per week     Comment: about 6 drinks per week  . Drug Use: No  . Sexual Activity: Not on file   Other Topics Concern  . Not on file   Social History Narrative   Engaged to be married to Trevor Watson     Objective: BP 130/74 mmHg  Pulse 74  Ht  (1.803 m)  Wt 201 lb (91.173 kg)  BMI 28.05 kg/m2  General: Alert and Oriented, No Acute Distress HEENT: Pupils equal, round, reactive to light. Conjunctivae clear.  Moist mucous membranes Lungs: Clear to auscultation bilaterally, no wheezing/ronchi/rales.  Comfortable work of breathing. Good air movement. Cardiac: Regular rate and rhythm. Normal S1/S2.  No murmurs, rubs, nor gallops. Left hip: Full range of motion and strength without reproduction of pain  with strength testing. Straight leg raise negative. Logroll is positive with external rotation. Faber positive.  No palpable pain in the left inguinal region. No left greater trochanteric tenderness with palpation.   Extremities: No peripheral edema.  Strong peripheral pulses.  Mental Status: No depression, anxiety, nor agitation. Skin: Warm and dry.  Assessment & Plan: Trevor Watson was seen today for follow-up.  Diagnoses and all orders for this visit:  Essential hypertension  Left hip pain Orders: -     DG HIP UNILAT WITH PELVIS 2-3 VIEWS LEFT; Future  Erectile dysfunction, unspecified erectile dysfunction type Orders: -     sildenafil (REVATIO) 20 MG tablet; One to four tabs by mouth daily as needed for  sex.   Essential hypertension: Controlled continue losartan Left hip pain: X-ray will be obtained to eval for intra-articular abnormalities, plan will be based on the results of this x-ray Erectile dysfunction: Provided with generic Viagra to be purchased at [facility] to help with Price.  Return if symptoms worsen or fail to improve.

## 2014-06-12 NOTE — Patient Instructions (Signed)
Ranitidine 150mg  one to two times a day as needed for reflux.

## 2014-06-15 ENCOUNTER — Encounter: Payer: Self-pay | Admitting: Family Medicine

## 2014-06-15 DIAGNOSIS — M1612 Unilateral primary osteoarthritis, left hip: Secondary | ICD-10-CM | POA: Insufficient documentation

## 2014-06-23 ENCOUNTER — Ambulatory Visit (INDEPENDENT_AMBULATORY_CARE_PROVIDER_SITE_OTHER): Payer: Managed Care, Other (non HMO) | Admitting: Sports Medicine

## 2014-06-23 ENCOUNTER — Encounter: Payer: Self-pay | Admitting: Sports Medicine

## 2014-06-23 DIAGNOSIS — M1612 Unilateral primary osteoarthritis, left hip: Secondary | ICD-10-CM

## 2014-06-23 MED ORDER — ACETAMINOPHEN ER 650 MG PO TBCR: 1300.0000 mg | EXTENDED_RELEASE_TABLET | Freq: Three times a day (TID) | ORAL | Status: DC | PRN

## 2014-06-23 MED ORDER — MELOXICAM 15 MG PO TABS: ORAL_TABLET | ORAL | Status: DC

## 2014-06-23 NOTE — Assessment & Plan Note (Addendum)
Patient declines interventional treatment today. Formal physical therapy, meloxicam, arthritis strength Tylenol. Return in a month, injection if no better.

## 2014-06-23 NOTE — Progress Notes (Signed)
   Subjective:    I'm seeing this patient as a consultation for:  Dr. Ivan AnchorsHommel  CC: Left hip pain  HPI: This is a pleasant 64 year old male with a several month history of pain that he localizes in the left groin, moderate, persistent without radiation, he does get gelling in the morning, no mechanical symptoms and no history of trauma. He has tried ibuprofen and naproxen without much improvement. Has not yet had any injections or therapy.  Past medical history, Surgical history, Family history not pertinant except as noted below, Social history, Allergies, and medications have been entered into the medical record, reviewed, and no changes needed.   Review of Systems: No headache, visual changes, nausea, vomiting, diarrhea, constipation, dizziness, abdominal pain, skin rash, fevers, chills, night sweats, weight loss, swollen lymph nodes, body aches, joint swelling, muscle aches, chest pain, shortness of breath, mood changes, visual or auditory hallucinations.   Objective:   General: Well Developed, well nourished, and in no acute distress.  Neuro/Psych: Alert and oriented x3, extra-ocular muscles intact, able to move all 4 extremities, sensation grossly intact. Skin: Warm and dry, no rashes noted.  Respiratory: Not using accessory muscles, speaking in full sentences, trachea midline.  Cardiovascular: Pulses palpable, no extremity edema. Abdomen: Does not appear distended. Left Hip: ROM IR: 30 Deg, ER: 60 Deg, Flexion: 120 Deg, Extension: 100 Deg, Abduction: 45 Deg, Adduction: 45 Deg, reproduction of pain with internal rotation of the left hip. Strength IR: 5/5, ER: 5/5, Flexion: 5/5, Extension: 5/5, Abduction: 5/5, Adduction: 5/5 Pelvic alignment unremarkable to inspection and palpation. Standing hip rotation and gait without trendelenburg / unsteadiness. Greater trochanter without tenderness to palpation. No tenderness over piriformis. No SI joint tenderness and normal minimal SI  movement.  X-rays personally reviewed and show moderate to severe left hip osteoarthritis with subchondral sclerosis, joint space narrowing and peripheral osteophytosis.  Impression and Recommendations:   This case required medical decision making of moderate complexity.

## 2014-07-01 ENCOUNTER — Ambulatory Visit (INDEPENDENT_AMBULATORY_CARE_PROVIDER_SITE_OTHER): Payer: Managed Care, Other (non HMO) | Admitting: Physical Therapy

## 2014-07-01 DIAGNOSIS — M25552 Pain in left hip: Secondary | ICD-10-CM

## 2014-07-01 NOTE — Patient Instructions (Signed)
    Machines Appropriate for Gym:  1. Leg Press  2. Calf Raises  3. Prone Single Leg Press  4. Hip Abduction and Adduction  5. Knee Extension  6. Hamstring Curls    Cardio Equipment for Gym:  1. Recumbent Bike  2. Elliptical  3. Treadmill  4. Seated Stepper  Take Caution: Running on Treadmill or Standing Stepper  Clarita CraneStephanie F Nagee Goates, PT, DPT 07/01/2014 8:33 AM  [facility] [address] [place], [place] [address]  [phone] (office) [phone] (fax)

## 2014-07-01 NOTE — Therapy (Addendum)
[address] [place], [place], [address] Phone: [phone]   Fax:  [phone]  Physical Therapy Evaluation  Patient Details  Name: Trevor Watson MRN: [id number] Date of Birth: 02/10/1951 Referring Provider:  Silverio Decamp,*  Encounter Date: 07/01/2014      PT End of Session - 07/01/14 0850    Visit Number 1   Number of Visits --  TBD if pt needs to return   PT Start Time 0805   PT Stop Time 0839   PT Time Calculation (min) 34 min   Activity Tolerance Patient tolerated treatment well   Behavior During Therapy [facility] for tasks assessed/performed      Past Medical History  Diagnosis Date  . Bronchitis     recurrent  . GERD (gastroesophageal reflux disease)   . IBS (irritable bowel syndrome)   . History of inguinal hernia repair   . Recurrent boils   . History of chest pain   . Asthma   . Allergy     Past Surgical History  Procedure Laterality Date  . Laparoscopic cholecystectomy  1996  . Left inguinal hernia repair  age 51  . Right inguinl hernia repair w/ mesh  09/07    Dr. Grandville Silos    There were no vitals filed for this visit.  Visit Diagnosis:  Left hip pain - Plan: PT plan of care cert/re-cert      Subjective Assessment - 07/01/14 0809    Symptoms Pt is a 64 y/o male who presents to OPPT for L hip pain x 2-3 months.  Pt reports symptoms provoked with twisting resulting in a sharp pain then progressed to difficulty walking.  Pt reports symptoms would then subside for a few days.  Pt went to sports med MD and he recommended new medication and exercise program.  Pt reports new meds and return to gym and pain has resolved   Limitations Walking   How long can you walk comfortably? 2-3 hours of yardwork caused "soreness"   Diagnostic tests xray: OA and bone spurs   Currently in Pain? No/denies   Aggravating Factors  twisting (pt reports none since MD visit); prolonged activity   Pain Relieving Factors medication, return to gym            [facility] PT Assessment - 07/01/14 0814    Assessment   Medical Diagnosis L hip OA   Onset Date --  2-3 months ago   Next MD Visit 1 month   Prior Therapy n/a   Precautions   Precautions None   Restrictions   Weight Bearing Restrictions No   Balance Screen   Has the patient fallen in the past 6 months No   Has the patient had a decrease in activity level because of a fear of falling?  No   Is the patient reluctant to leave their home because of a fear of falling?  No   Home Environment   Living Enviornment Private residence   Living Arrangements Spouse/significant other   Type of [place] One level   Prior Function   Level of Independence Independent with basic ADLs;Independent with transfers;Independent with gait   Vocation Full time employment   Vocation Requirements sitting at desk throughout day; flexibility to walk around   Leisure 2-3 days/wk at [employer]; ride motorcycles   Cognition   Overall Cognitive Status Within Functional Limits for tasks assessed   Observation/Other Assessments   Focus on  Therapeutic Outcomes (FOTO)  51 (49% limited; predicted 35% limited)   AROM   AROM Assessment Site Hip;Knee   Right/Left Hip Left   Left Hip Extension 15   Left Hip Flexion 120   Left Hip ABduction 40   Strength   Strength Assessment Site Hip;Knee   Right/Left Hip Right;Left   Right Hip Flexion 5/5   Right Hip Extension 5/5   Right Hip ABduction 5/5   Right Hip ADduction 5/5   Left Hip Flexion 5/5   Left Hip Extension 5/5   Left Hip ABduction 5/5   Left Hip ADduction 5/5   Right/Left Knee Right;Left   Right Knee Flexion 5/5   Right Knee Extension 5/5   Left Knee Flexion 5/5   Left Knee Extension 5/5   Palpation   Palpation no tenderness noted   Special Tests    Special Tests Hip Special Tests   Hip Special Tests  Saralyn Pilar (FABER) Test;Ober's Test;Ely's Test   Saralyn Pilar [facility]) Test    Findings Negative   Ober's Test   Findings Negative   Ely's Test   Findings Negative                           PT Education - 07/01/14 0849    Education provided Yes   Education Details clinical findings; POC and safe fitness center exercises and activities   Person(s) Educated Patient   Methods Explanation;Handout   Comprehension Verbalized understanding                    Plan - 07/01/14 0850    Clinical Impression Statement Pt presents to OPPT with 2-3 month history of L hip pain, now resolved.  Pt reports MD added new medication and pt has returned to gym and now reports resolve of symptoms.  At this time pt without any functional deficits and therefore skilled PT not indicated.  Educated on safe fitness center exercises and equipment and pt verbalized understanding.  Instructed pt to call within 30 days if pain returns.  Pt verbalized understanding.   Pt will benefit from skilled therapeutic intervention in order to improve on the following deficits Pain  if pt returns   PT Treatment/Interventions ADLs/Self Care Home Management;Moist Heat;Therapeutic activities;Patient/family education;Therapeutic exercise;Ultrasound;Manual techniques;Cryotherapy;Electrical Stimulation;Functional mobility training  if pt returns   PT Next Visit Plan if pt returns assess pain and limitations; treat as indicated   Consulted and Agree with Plan of Care Patient         Problem List Patient Active Problem List   Diagnosis Date Noted  . Osteoarthritis of left hip 06/15/2014  . Airway hyperreactivity 03/16/2014  . Acid reflux 03/16/2014  . Thyroid nodule 12/03/2013  . Hyperglycemia 12/03/2013  . Recurrent boils 12/03/2013  . Tremor 12/03/2013  . Degenerative joint disease of hand 12/03/2013  . Arthritis of hand, degenerative 12/03/2013  . Anterior neck pain 01/31/2013  . Hypertension 10/11/2011  . Essential (primary) hypertension 10/11/2011  . Snoring  03/30/2011  . Asthmatic bronchitis 03/30/2011  . INGUINAL HERNIA 03/14/2009  . CHEST PAIN, ATYPICAL 03/14/2009  . GERD 03/01/2009  . IRRITABLE BOWEL SYNDROME 03/01/2009  . 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  Patient goals were not met. Patient is being discharged due to not returning since the last visit.  ?????       Madelyn Flavors, PT 08/03/2014 3:44 PM  [facility] at [place],  [address]  [phone] (office) [phone] (fax)

## 2014-07-24 ENCOUNTER — Encounter: Payer: Self-pay | Admitting: Sports Medicine

## 2014-07-24 ENCOUNTER — Ambulatory Visit (INDEPENDENT_AMBULATORY_CARE_PROVIDER_SITE_OTHER): Payer: Managed Care, Other (non HMO) | Admitting: Sports Medicine

## 2014-07-24 VITALS — BP 147/83 | HR 65 | Ht 71.0 in | Wt 197.0 lb

## 2014-07-24 DIAGNOSIS — M1612 Unilateral primary osteoarthritis, left hip: Secondary | ICD-10-CM | POA: Diagnosis not present

## 2014-07-24 MED ORDER — MELOXICAM 15 MG PO TABS: ORAL_TABLET | ORAL | Status: DC

## 2014-07-24 NOTE — Progress Notes (Signed)
  Subjective:    CC: Follow-up  HPI: Left hip osteoarthritis: Pain is essentially resolved with formal physical therapy and medications.  Past medical history, Surgical history, Family history not pertinant except as noted below, Social history, Allergies, and medications have been entered into the medical record, reviewed, and no changes needed.   Review of Systems: No fevers, chills, night sweats, weight loss, chest pain, or shortness of breath.   Objective:    General: Well Developed, well nourished, and in no acute distress.  Neuro: Alert and oriented x3, extra-ocular muscles intact, sensation grossly intact.  HEENT: Normocephalic, atraumatic, pupils equal round reactive to light, neck supple, no masses, no lymphadenopathy, thyroid nonpalpable.  Skin: Warm and dry, no rashes. Cardiac: Regular rate and rhythm, no murmurs rubs or gallops, no lower extremity edema.  Respiratory: Clear to auscultation bilaterally. Not using accessory muscles, speaking in full sentences. Left Hip: ROM IR: 60 Deg, ER: 60 Deg, Flexion: 120 Deg, Extension: 100 Deg, Abduction: 45 Deg, Adduction: 45 Deg Strength IR: 5/5, ER: 5/5, Flexion: 5/5, Extension: 5/5, Abduction: 5/5, Adduction: 5/5 Pelvic alignment unremarkable to inspection and palpation. Standing hip rotation and gait without trendelenburg / unsteadiness. Greater trochanter without tenderness to palpation. No tenderness over piriformis. No SI joint tenderness and normal minimal SI movement.  Impression and Recommendations:

## 2014-07-24 NOTE — Assessment & Plan Note (Signed)
Overall doing very well, pain is essentially resolved after formal physical therapy, meloxicam, and occasional Tylenol. Return to see me on an as-needed basis.

## 2014-07-28 ENCOUNTER — Telehealth: Payer: Self-pay | Admitting: *Deleted

## 2014-07-28 NOTE — Telephone Encounter (Signed)
Pt called in today with increasing BPs.  This morning before his losartan it was 205/109, close to 2 hrs later it had dropped to 158/91, then around lunch it was back up into the 180s/100s.  Doesn't not complain of any abnormal sx other than a brief nose bleed yesterday.  I advised him to keep a log of his readings before & after medication and to bring those in with him for his appt.  I transferred him to scheduling and advised that he get in sooner rather than later.  Looking back in his chart, it looks as though his BP reading have been consistently elevated.  Just an FYI.

## 2014-07-31 ENCOUNTER — Ambulatory Visit (INDEPENDENT_AMBULATORY_CARE_PROVIDER_SITE_OTHER): Payer: Managed Care, Other (non HMO) | Admitting: Family Medicine

## 2014-07-31 ENCOUNTER — Encounter: Payer: Self-pay | Admitting: Family Medicine

## 2014-07-31 VITALS — BP 171/87 | HR 69 | Wt 196.0 lb

## 2014-07-31 DIAGNOSIS — L0390 Cellulitis, unspecified: Secondary | ICD-10-CM | POA: Diagnosis not present

## 2014-07-31 DIAGNOSIS — I10 Essential (primary) hypertension: Secondary | ICD-10-CM

## 2014-07-31 MED ORDER — CEPHALEXIN 500 MG PO CAPS: 500.0000 mg | ORAL_CAPSULE | Freq: Three times a day (TID) | ORAL | Status: DC

## 2014-07-31 MED ORDER — LOSARTAN POTASSIUM-HCTZ 100-25 MG PO TABS: 1.0000 | ORAL_TABLET | Freq: Every day | ORAL | Status: DC

## 2014-07-31 NOTE — Progress Notes (Signed)
CC: Trevor Watson is a 63 y.o. male is here for Hypertension   Subjective: HPI:  Follow-up essential hypertension: He checked his blood pressure grocery store and the systolic value was 200 last week. He did not believe this and began checking his blood pressures at home on a daily basis. He brings in 1 week's worth of blood pressures measured 4 times a day all of which are in the stage I or 2 hypertension range. He tells me he feels great and has no physical complaints. Denies chest pain shortness of breath orthopnea or peripheral edema motor or sensory disturbances or headache. There's been no change to physical activity or sodium intake.  Complains of a spot on his left hand that originally looked like a small pimple that began to grow peripherally. It was red and tender. He's taken 3 days of Keflex 3 times a day from his wife and it seems like it's improving. He denies fevers chills swollen lymph nodes nor skin changes elsewhere  Review Of Systems Outlined In HPI  Past Medical History  Diagnosis Date  . Bronchitis     recurrent  . GERD (gastroesophageal reflux disease)   . IBS (irritable bowel syndrome)   . History of inguinal hernia repair   . Recurrent boils   . History of chest pain   . Asthma   . Allergy     Past Surgical History  Procedure Laterality Date  . Laparoscopic cholecystectomy  1996  . Left inguinal hernia repair  age 63  . Right inguinl hernia repair w/ mesh  09/07    Dr. Janee Morn   Family History  Problem Relation Age of Onset  . Breast cancer Mother   . Thyroid cancer Father   . Hypertension Father   . Parkinson's disease Father   . Colon cancer Neg Hx     History   Social History  . Marital Status: Married    Spouse Name: [name]  . Number of Children: 3  . Years of Education: N/A   Occupational History  . accountant    Social History Main Topics  . Smoking status: Former Smoker -- 2.00 packs/day for 30 years    Types: Cigarettes    Quit  date: 04/17/2001  . Smokeless tobacco: Never Used     Comment: exposed to second hand smoke  . Alcohol Use: 3.6 oz/week    6 Cans of beer per week     Comment: about 6 drinks per week  . Drug Use: No  . Sexual Activity: Not on file   Other Topics Concern  . Not on file   Social History Narrative   Engaged to be married to [name]     Objective: BP 171/87 mmHg  Pulse 69  Wt 196 lb (88.905 kg)  General: Alert and Oriented, No Acute Distress HEENT: Pupils equal, round, reactive to light. Conjunctivae clear. Moist mucous membranes Lungs: Clear to auscultation bilaterally, no wheezing/ronchi/rales.  Comfortable work of breathing. Good air movement. Cardiac: Regular rate and rhythm. Normal S1/S2.  No murmurs, rubs, nor gallops.   Extremities: No peripheral edema.  Strong peripheral pulses.  Mental Status: No depression, anxiety, nor agitation. Skin: Warm and dry. Patch of erythema on the left base of the thumb without any induration warmth or fluctuance, approximately 1 second. Diameter  Assessment & Plan: Trevor Watson was seen today for hypertension.  Diagnoses and all orders for this visit:  Essential hypertension  Cellulitis, unspecified cellulitis site, unspecified extremity site,  unspecified laterality  Other orders -     losartan-hydrochlorothiazide (HYZAAR) 100-25 MG per tablet; Take 1 tablet by mouth daily. -     cephALEXin (KEFLEX) 500 MG capsule; Take 1 capsule (500 mg total) by mouth 3 (three) times daily.   Essential hypertension: Uncontrolled chronic condition begin hydrochlorothiazide component to his losartan. I've asked him to drop off a weeks worth of blood pressure values next week for further recommendations. Cellulitis of the left hand: Continue Keflex but with his own prescription.  Return if symptoms worsen or fail to improve.

## 2014-10-28 ENCOUNTER — Other Ambulatory Visit: Payer: Self-pay | Admitting: Family Medicine

## 2014-10-29 NOTE — Telephone Encounter (Signed)
Needs appt before future refills.please put this on rx lable

## 2014-12-29 ENCOUNTER — Other Ambulatory Visit: Payer: Self-pay | Admitting: Family Medicine

## 2015-01-11 ENCOUNTER — Encounter: Payer: Self-pay | Admitting: Family Medicine

## 2015-01-11 ENCOUNTER — Ambulatory Visit (INDEPENDENT_AMBULATORY_CARE_PROVIDER_SITE_OTHER): Payer: Managed Care, Other (non HMO) | Admitting: Family Medicine

## 2015-01-11 VITALS — BP 128/72 | HR 75 | Wt 194.0 lb

## 2015-01-11 DIAGNOSIS — Z23 Encounter for immunization: Secondary | ICD-10-CM

## 2015-01-11 DIAGNOSIS — E041 Nontoxic single thyroid nodule: Secondary | ICD-10-CM

## 2015-01-11 DIAGNOSIS — I10 Essential (primary) hypertension: Secondary | ICD-10-CM | POA: Diagnosis not present

## 2015-01-11 MED ORDER — LOSARTAN POTASSIUM-HCTZ 100-25 MG PO TABS: 1.0000 | ORAL_TABLET | Freq: Every day | ORAL | Status: DC

## 2015-01-11 NOTE — Progress Notes (Signed)
CC: Trevor Watson is a 64 y.o. male is here for Hypertension   Subjective: HPI:  Follow-up essential hypertension: Since starting hydrochlorothiazide with losartan he has noticed all blood pressures at home and in the normotensive range. He was checking around the time this change was made and stopped early in the summer, over the past week he's been checking it daily and all members are normal. He denies chest pain shortness of breath orthopnea or peripheral edema  It's been August 1 year since he had his thyroid scan last. He has a family history of thyroid cancer in his father. He denies any unintentional weight loss or gain. He denies any anxiety or swelling of the neck.   Review Of Systems Outlined In HPI  Past Medical History  Diagnosis Date  . Bronchitis     recurrent  . GERD (gastroesophageal reflux disease)   . IBS (irritable bowel syndrome)   . History of inguinal hernia repair   . Recurrent boils   . History of chest pain   . Asthma   . Allergy     Past Surgical History  Procedure Laterality Date  . Laparoscopic cholecystectomy  1996  . Left inguinal hernia repair  age 61  . Right inguinl hernia repair w/ mesh  09/07    Dr. Janee Morn   Family History  Problem Relation Age of Onset  . Breast cancer Mother   . Thyroid cancer Father   . Hypertension Father   . Parkinson's disease Father   . Colon cancer Neg Hx     Social History   Social History  . Marital Status: Married    Spouse Name: Trevor Watson  . Number of Children: 3  . Years of Education: N/A   Occupational History  . accountant    Social History Main Topics  . Smoking status: Former Smoker -- 2.00 packs/day for 30 years    Types: Cigarettes    Quit date: 04/17/2001  . Smokeless tobacco: Never Used     Comment: exposed to second hand smoke  . Alcohol Use: 3.6 oz/week    6 Cans of beer per week     Comment: about 6 drinks per week  . Drug Use: No  . Sexual Activity: Not on file   Other  Topics Concern  . Not on file   Social History Narrative   Engaged to be married to [name]     Objective: BP 128/72 mmHg  Pulse 75  Wt 194 lb (87.998 kg)  General: Alert and Oriented, No Acute Distress HEENT: Pupils equal, round, reactive to light. Conjunctivae clear.    Moist mucous membranes, pharynx without inflammation nor lesions.  Neck supple without palpable lymphadenopathy nor abnormal masses. Lungs: Clear to auscultation bilaterally, no wheezing/ronchi/rales.  Comfortable work of breathing. Good air movement. Cardiac: Regular rate and rhythm. Normal S1/S2.  No murmurs, rubs, nor gallops.   Extremities: No peripheral edema.  Strong peripheral pulses.  Mental Status: No depression, anxiety, nor agitation. Skin: Warm and dry.  Assessment & Plan: Trevor Watson was seen today for hypertension.  Diagnoses and all orders for this visit:  Essential hypertension -     BASIC METABOLIC PANEL WITH GFR  Thyroid nodule -     US Soft Tissue Head/Neck  Other orders -     Flu Vaccine QUAD 36+ mos IM -     losartan-hydrochlorothiazide (HYZAAR) 100-25 MG per tablet; Take 1 tablet by mouth daily.   Essential hypertension: Controlled continue Hyzaar  daily pending renal function. Thyroid nodule: Asymptomatic, given family history of thyroid cancer checking annual thyroid ultrasound in one month.  Return in about 6 months (around 07/11/2015) for BP.

## 2015-01-12 LAB — BASIC METABOLIC PANEL WITH GFR
BUN: 27 mg/dL — ABNORMAL HIGH (ref 7–25)
CALCIUM: 9.2 mg/dL (ref 8.6–10.3)
CO2: 30 mmol/L (ref 20–31)
CREATININE: 1.41 mg/dL — AB (ref 0.70–1.25)
Chloride: 102 mmol/L (ref 98–110)
GFR, EST AFRICAN AMERICAN: 61 mL/min (ref >=60)
GFR, EST NON AFRICAN AMERICAN: 53 mL/min — AB (ref >=60)
GLUCOSE: 117 mg/dL — AB (ref 65–99)
Potassium: 4.2 mmol/L (ref 3.5–5.3)
SODIUM: 141 mmol/L (ref 135–146)

## 2015-01-16 ENCOUNTER — Emergency Department (INDEPENDENT_AMBULATORY_CARE_PROVIDER_SITE_OTHER)
Admission: EM | Admit: 2015-01-16 | Discharge: 2015-01-16 | Disposition: A | Discharge status: Home / Self Care | Payer: Managed Care, Other (non HMO) | Source: Home / Self Care | Attending: Family Medicine | Admitting: Family Medicine

## 2015-01-16 ENCOUNTER — Encounter: Payer: Self-pay | Admitting: *Deleted

## 2015-01-16 DIAGNOSIS — J069 Acute upper respiratory infection, unspecified: Secondary | ICD-10-CM | POA: Diagnosis not present

## 2015-01-16 DIAGNOSIS — B9789 Other viral agents as the cause of diseases classified elsewhere: Principal | ICD-10-CM

## 2015-01-16 MED ORDER — BENZONATATE 200 MG PO CAPS: 200.0000 mg | ORAL_CAPSULE | Freq: Every day | ORAL | Status: DC

## 2015-01-16 MED ORDER — DOXYCYCLINE HYCLATE 100 MG PO CAPS: 100.0000 mg | ORAL_CAPSULE | Freq: Two times a day (BID) | ORAL | Status: DC

## 2015-01-16 NOTE — Discharge Instructions (Signed)
Take plain guaifenesin (  extended release tabs such as Mucinex) twice daily, with plenty of water, for cough and congestion.  May add Pseudoephedrine ( , one or two every 4 to 6 hours) for sinus congestion if blood pressure is not affected.  Get adequate rest.   May use Afrin nasal spray (or generic oxymetazoline) twice daily for about 5 days and then discontinue.  Also recommend using saline nasal spray several times daily and saline nasal irrigation (AYR is a common brand).  Use Flonase nasal spray each morning after using Afrin nasal spray and saline nasal irrigation. May use albuterol inhaler as needed.  Stop all antihistamines for now, and other non-prescription cough/cold preparations. Begin Doxycycline if not improving about one week or if persistent fever develops

## 2015-01-16 NOTE — ED Notes (Signed)
Pt complains of cough and chest congestion for 1 week.  He said it has improved a little bit but is still bad at night.  He has not been able to produce anything when coughing.  Denies fevers or nasal cong.

## 2015-01-16 NOTE — ED Provider Notes (Signed)
CSN: [id number]     Arrival date & time 01/16/15  1140 History   First MD Initiated Contact with Patient 01/16/15 1258     Chief Complaint  Patient presents with  . Cough      HPI Comments: Patient complains of five day history of typical cold-like symptoms including mild sinus congestion, fatigue, and cough but no sore throat.  His cough is partly productive and decreasing, and he feels better.   The history is provided by the patient.    Past Medical History  Diagnosis Date  . Bronchitis     recurrent  . GERD (gastroesophageal reflux disease)   . IBS (irritable bowel syndrome)   . History of inguinal hernia repair   . Recurrent boils   . History of chest pain   . Asthma   . Allergy    Past Surgical History  Procedure Laterality Date  . Laparoscopic cholecystectomy  1996  . Left inguinal hernia repair  age 77  . Right inguinl hernia repair w/ mesh  09/07    Dr. Janee Morn   Family History  Problem Relation Age of Onset  . Breast cancer Mother   . Thyroid cancer Father   . Hypertension Father   . Parkinson's disease Father   . Colon cancer Neg Hx    Social History  Substance Use Topics  . Smoking status: Former Smoker -- 2.00 packs/day for 30 years    Types: Cigarettes    Quit date: 04/17/2001  . Smokeless tobacco: Never Used     Comment: exposed to second hand smoke  . Alcohol Use: 3.6 oz/week    6 Cans of beer per week     Comment: about 6 drinks per week    Review of Systems No sore throat + cough No pleuritic pain No wheezing + nasal congestion + post-nasal drainage No sinus pain/pressure No itchy/red eyes No earache No hemoptysis No SOB No fever/chills No nausea No vomiting No abdominal pain No diarrhea No urinary symptoms No skin rash + fatigue No myalgias No headache    Allergies  Review of patient's allergies indicates no known allergies.  Home Medications   Prior to Admission medications   Medication Sig Start Date End Date Taking?  Authorizing Provider  acetaminophen (TYLENOL) 650 MG CR tablet Take 2 tablets (1,300 mg total) by mouth every 8 (eight) hours as needed for pain. 06/23/14   Monica Becton, MD  aspirin 81 MG tablet Take 81 mg by mouth daily.      Historical Provider, MD  benzonatate (TESSALON) 200 MG capsule Take 1 capsule (200 mg total) by mouth at bedtime. Take as needed for cough 01/16/15   Lattie Haw, MD  doxycycline (VIBRAMYCIN) 100 MG capsule Take 1 capsule (100 mg total) by mouth 2 (two) times daily. Take with food (Rx void after 01/23/15) 01/16/15   Lattie Haw, MD  losartan-hydrochlorothiazide (HYZAAR) 100-25 MG per tablet Take 1 tablet by mouth daily. 01/11/15   Laren Boom, DO  meloxicam (MOBIC) 15 MG tablet One tab PO qAM with breakfast for 2 weeks, then daily prn pain. 07/24/14   Monica Becton, MD  omeprazole (PRILOSEC OTC) 20 MG tablet Take 1 tablet (20 mg total) by mouth daily. 02/17/13   Michele Mcalpine, MD  sildenafil (REVATIO) 20 MG tablet One to four tabs by mouth daily as needed for sex. 06/12/14   Laren Boom, DO   Meds Ordered and Administered this Visit  Medications - No data  to display  BP 114/68 mmHg  Pulse 60  Temp(Src) 99 F (37.2 C) (Oral)  Ht 6' (1.829 m)  Wt 192 lb (87.091 kg)  BMI 26.03 kg/m2  SpO2 97% No data found.   Physical Exam Nursing notes and Vital Signs reviewed. Appearance:  Patient appears stated age, and in no acute distress Eyes:  Pupils are equal, round, and reactive to light and accomodation.  Extraocular movement is intact.  Conjunctivae are not inflamed  Ears:  Canals normal.  Tympanic membranes normal.  Nose:  Mildly congested turbinates.  No sinus tenderness.   Pharynx:  Normal Neck:  Supple.  Slightly tender shotty posterior nodes are palpated bilaterally  Lungs:  Clear to auscultation.  Breath sounds are equal.  Moving air well. Heart:  Regular rate and rhythm without murmurs, rubs, or gallops.  Abdomen:  Nontender without masses or  hepatosplenomegaly.  Bowel sounds are present.  No CVA or flank tenderness.  Extremities:  No edema.  No calf tenderness Skin:  No rash present.   ED Course  Procedures    MDM   1. Viral URI with cough    There is no evidence of bacterial infection today.   Prescription written for Benzonatate [facility]) to take at bedtime for night-time cough.  Take plain guaifenesin (  extended release tabs such as Mucinex) twice daily, with plenty of water, for cough and congestion.  May add Pseudoephedrine ( , one or two every 4 to 6 hours) for sinus congestion if blood pressure is not affected.  Get adequate rest.   May use Afrin nasal spray (or generic oxymetazoline) twice daily for about 5 days and then discontinue.  Also recommend using saline nasal spray several times daily and saline nasal irrigation (AYR is a common brand).  Use Flonase nasal spray each morning after using Afrin nasal spray and saline nasal irrigation. May use albuterol inhaler as needed.  Stop all antihistamines for now, and other non-prescription cough/cold preparations. Begin Doxycycline if not improving about one week or if persistent fever develops (Given a prescription to hold, with an expiration date)     Lattie Haw, MD 01/17/15 [phone]

## 2015-02-11 ENCOUNTER — Other Ambulatory Visit: Payer: Managed Care, Other (non HMO)

## 2015-02-12 ENCOUNTER — Ambulatory Visit (INDEPENDENT_AMBULATORY_CARE_PROVIDER_SITE_OTHER): Payer: Managed Care, Other (non HMO)

## 2015-02-12 DIAGNOSIS — E041 Nontoxic single thyroid nodule: Secondary | ICD-10-CM

## 2015-03-17 ENCOUNTER — Encounter: Payer: Self-pay | Admitting: Family Medicine

## 2015-03-17 MED ORDER — ALBUTEROL SULFATE HFA 108 (90 BASE) MCG/ACT IN AERS: 2.0000 | INHALATION_SPRAY | Freq: Four times a day (QID) | RESPIRATORY_TRACT | Status: DC | PRN

## 2015-04-30 ENCOUNTER — Encounter: Payer: Self-pay | Admitting: Family Medicine

## 2015-04-30 MED ORDER — SULFAMETHOXAZOLE-TRIMETHOPRIM 800-160 MG PO TABS: 1.0000 | ORAL_TABLET | Freq: Two times a day (BID) | ORAL | Status: DC

## 2015-04-30 NOTE — Telephone Encounter (Signed)
Yes of course, since this is an ongoing problem that flares up now and then I'll send in a Rx of Trimethoprim-Sulfa to [facility].

## 2015-07-05 ENCOUNTER — Encounter: Payer: Self-pay | Admitting: Family Medicine

## 2015-07-05 ENCOUNTER — Ambulatory Visit (INDEPENDENT_AMBULATORY_CARE_PROVIDER_SITE_OTHER): Payer: Managed Care, Other (non HMO) | Admitting: Family Medicine

## 2015-07-05 VITALS — BP 128/85

## 2015-07-05 DIAGNOSIS — M1612 Unilateral primary osteoarthritis, left hip: Secondary | ICD-10-CM

## 2015-07-05 DIAGNOSIS — I10 Essential (primary) hypertension: Secondary | ICD-10-CM

## 2015-07-05 DIAGNOSIS — E041 Nontoxic single thyroid nodule: Secondary | ICD-10-CM

## 2015-07-05 LAB — TSH: TSH: 2.35 m[IU]/L (ref 0.40–4.50)

## 2015-07-05 NOTE — Progress Notes (Signed)
CC: Trevor Watson is a 65 y.o. male is here for No chief complaint on file.   Subjective: HPI:  Follow-up essential hypertension: Taking losartan-hydrochlorothiazide with 100% compliance and no known side effects. No outside blood pressures to report. No chest pain shortness of breath orthopnea nor peripheral edema.  Osteoarthritis of the left hip: Symptoms were nonexistent while going to the gym and doing leg exercises most days out of the week. Around December he stopped going to the gym and noticed that pain localized in the left groin slowly was returning. Symptoms are worse with bending forward or with external rotation of the hip. He tells me the meloxicam seems to help him get through day without any pain that interferes with quality of life. He denies any chemical symptoms.  Follow-up thyroid nodule: Denies any difficulty swallowing or change in voice. No unintentional weight loss or gain. No neck pain.   Review Of Systems Outlined In HPI  Past Medical History  Diagnosis Date  . Bronchitis     recurrent  . GERD (gastroesophageal reflux disease)   . IBS (irritable bowel syndrome)   . History of inguinal hernia repair   . Recurrent boils   . History of chest pain   . Asthma   . Allergy     Past Surgical History  Procedure Laterality Date  . Laparoscopic cholecystectomy  1996  . Left inguinal hernia repair  age 57  . Right inguinl hernia repair w/ mesh  09/07    Dr. Janee Morn   Family History  Problem Relation Age of Onset  . Breast cancer Mother   . Thyroid cancer Father   . Hypertension Father   . Parkinson's disease Father   . Colon cancer Neg Hx     Social History   Social History  . Marital Status: Married    Spouse Name: [name]  . Number of Children: 3  . Years of Education: N/A   Occupational History  . accountant    Social History Main Topics  . Smoking status: Former Smoker -- 2.00 packs/day for 30 years    Types: Cigarettes    Quit date:  04/17/2001  . Smokeless tobacco: Never Used     Comment: exposed to second hand smoke  . Alcohol Use: 3.6 oz/week    6 Cans of beer per week     Comment: about 6 drinks per week  . Drug Use: No  . Sexual Activity: Not on file   Other Topics Concern  . Not on file   Social History Narrative   Engaged to be married to [name]     Objective: BP 128/85 mmHg  General: Alert and Oriented, No Acute Distress HEENT: Pupils equal, round, reactive to light. Conjunctivae clear.  Moist mucous membranes Lungs: Clear to auscultation bilaterally, no wheezing/ronchi/rales.  Comfortable work of breathing. Good air movement. Cardiac: Regular rate and rhythm. Normal S1/S2.  No murmurs, rubs, nor gallops.   Extremities: No peripheral edema.  Strong peripheral pulses.  Mental Status: No depression, anxiety, nor agitation. Skin: Warm and dry.  Assessment & Plan: Diagnoses and all orders for this visit:  Essential hypertension  Thyroid nodule -     TSH  Primary osteoarthritis of left hip   Essential hypertension: Controlled, continue losartan-hydrochlorothiazide. Thyroid nodule with family history of thyroid cancer: Asymptomatic, due for repeat TSH. OA Hip: Counseled that he's a candidate for a steroid injection however if meloxicam is giving him satisfactory pain relief we can stick  to this until it loses it's effectiveness.   Return in about 6 months (around 01/05/2016).

## 2015-07-21 ENCOUNTER — Other Ambulatory Visit: Payer: Self-pay

## 2015-07-21 ENCOUNTER — Encounter: Payer: Self-pay | Admitting: Family Medicine

## 2015-07-21 DIAGNOSIS — N529 Male erectile dysfunction, unspecified: Secondary | ICD-10-CM

## 2015-07-21 MED ORDER — SILDENAFIL CITRATE 20 MG PO TABS: ORAL_TABLET | ORAL | Status: DC

## 2015-08-09 ENCOUNTER — Ambulatory Visit (INDEPENDENT_AMBULATORY_CARE_PROVIDER_SITE_OTHER): Payer: Managed Care, Other (non HMO) | Admitting: Sports Medicine

## 2015-08-09 VITALS — BP 145/76 | HR 86 | Resp 18 | Wt 204.0 lb

## 2015-08-09 DIAGNOSIS — M1612 Unilateral primary osteoarthritis, left hip: Secondary | ICD-10-CM

## 2015-08-09 NOTE — Assessment & Plan Note (Signed)
Recurrence of pain, hip joint injection as above.

## 2015-08-09 NOTE — Progress Notes (Signed)
  Subjective:    CC:  Recurrence of left hip pain  HPI: Primary osteoarthritis of left hip: Overall did well with NSAIDs and physical therapy, unfortunately having a plateauing affect with persistent pain in the groin, moderate, persistent without radiation. Desires interventional treatment today.  Past medical history, Surgical history, Family history not pertinant except as noted below, Social history, Allergies, and medications have been entered into the medical record, reviewed, and no changes needed.   Review of Systems: No fevers, chills, night sweats, weight loss, chest pain, or shortness of breath.   Objective:    General: Well Developed, well nourished, and in no acute distress.  Neuro: Alert and oriented x3, extra-ocular muscles intact, sensation grossly intact.  HEENT: Normocephalic, atraumatic, pupils equal round reactive to light, neck supple, no masses, no lymphadenopathy, thyroid nonpalpable.  Skin: Warm and dry, no rashes. Cardiac: Regular rate and rhythm, no murmurs rubs or gallops, no lower extremity edema.  Respiratory: Clear to auscultation bilaterally. Not using accessory muscles, speaking in full sentences.  Procedure: Real-time Ultrasound Guided Injection of left femoroacetabular joint Device: GE Logiq E  Verbal informed consent obtained.  Time-out conducted.  Noted no overlying erythema, induration, or other signs of local infection.  Skin prepped in a sterile fashion.  Local anesthesia: Topical Ethyl chloride.  With sterile technique and under real time ultrasound guidance:  22-gauge spinal needle advanced to the femoral head/neck junction, 2 mL kenalog 40, 2 mL lidocaine, 2 mL Marcaine injected easily. Completed without difficulty  Pain immediately resolved suggesting accurate placement of the medication.  Advised to call if fevers/chills, erythema, induration, drainage, or persistent bleeding.  Images permanently stored and available for review in the  ultrasound unit.  Impression: Technically successful ultrasound guided injection.  Impression and Recommendations:

## 2015-08-25 ENCOUNTER — Encounter: Payer: Self-pay | Admitting: Sports Medicine

## 2015-08-25 DIAGNOSIS — M1612 Unilateral primary osteoarthritis, left hip: Secondary | ICD-10-CM

## 2015-08-26 MED ORDER — MELOXICAM 15 MG PO TABS: ORAL_TABLET | ORAL | Status: DC

## 2015-09-06 ENCOUNTER — Ambulatory Visit (INDEPENDENT_AMBULATORY_CARE_PROVIDER_SITE_OTHER): Payer: Managed Care, Other (non HMO) | Admitting: Sports Medicine

## 2015-09-06 ENCOUNTER — Encounter: Payer: Self-pay | Admitting: Sports Medicine

## 2015-09-06 DIAGNOSIS — M1612 Unilateral primary osteoarthritis, left hip: Secondary | ICD-10-CM | POA: Diagnosis not present

## 2015-09-06 MED ORDER — COSAMIN ASU ADVANCED FORMULA PO CAPS: 2.0000 | ORAL_CAPSULE | Freq: Two times a day (BID) | ORAL | Status: DC

## 2015-09-06 NOTE — Progress Notes (Signed)
  Subjective:    CC: Follow-up  HPI: Left hip osteoarthritis: Did well initially after the first injection, unfortunately had a recurrence of pain very quickly after a week after the second injection. At this point he is agreeable to consult with the surgeon.  Past medical history, Surgical history, Family history not pertinant except as noted below, Social history, Allergies, and medications have been entered into the medical record, reviewed, and no changes needed.   Review of Systems: No fevers, chills, night sweats, weight loss, chest pain, or shortness of breath.   Objective:    General: Well Developed, well nourished, and in no acute distress.  Neuro: Alert and oriented x3, extra-ocular muscles intact, sensation grossly intact.  HEENT: Normocephalic, atraumatic, pupils equal round reactive to light, neck supple, no masses, no lymphadenopathy, thyroid nonpalpable.  Skin: Warm and dry, no rashes. Cardiac: Regular rate and rhythm, no murmurs rubs or gallops, no lower extremity edema.  Respiratory: Clear to auscultation bilaterally. Not using accessory muscles, speaking in full sentences.  Impression and Recommendations:

## 2015-09-06 NOTE — Assessment & Plan Note (Signed)
Good response al injection, failed the most recent one. At this point he is a candidate for hip arthroplasty, referral to Dr. Turner Danielsowan. Continue meloxicam, Tylenol 3 times a day, adding glucosamine/chondroitin.

## 2015-12-10 ENCOUNTER — Other Ambulatory Visit: Payer: Self-pay | Admitting: Orthopedic Surgery

## 2015-12-27 ENCOUNTER — Ambulatory Visit (HOSPITAL_COMMUNITY)
Admission: RE | Admit: 2015-12-27 | Discharge: 2015-12-27 | Disposition: A | Discharge status: Home / Self Care | Payer: Managed Care, Other (non HMO) | Source: Ambulatory Visit | Attending: Orthopedic Surgery | Admitting: Orthopedic Surgery

## 2015-12-27 ENCOUNTER — Encounter (HOSPITAL_COMMUNITY): Payer: Self-pay

## 2015-12-27 ENCOUNTER — Other Ambulatory Visit: Payer: Self-pay

## 2015-12-27 ENCOUNTER — Encounter (HOSPITAL_COMMUNITY)
Admission: RE | Admit: 2015-12-27 | Discharge: 2015-12-27 | Disposition: A | Discharge status: Home / Self Care | Payer: Managed Care, Other (non HMO) | Source: Ambulatory Visit | Attending: Orthopedic Surgery | Admitting: Orthopedic Surgery

## 2015-12-27 DIAGNOSIS — Z01818 Encounter for other preprocedural examination: Secondary | ICD-10-CM | POA: Insufficient documentation

## 2015-12-27 DIAGNOSIS — Z01810 Encounter for preprocedural cardiovascular examination: Secondary | ICD-10-CM | POA: Insufficient documentation

## 2015-12-27 HISTORY — DX: Unspecified osteoarthritis, unspecified site: M19.90

## 2015-12-27 HISTORY — DX: Anxiety disorder, unspecified: F41.9

## 2015-12-27 HISTORY — DX: Nontoxic single thyroid nodule: E04.1

## 2015-12-27 HISTORY — DX: Essential (primary) hypertension: I10

## 2015-12-27 LAB — ABO/RH: ABO/RH(D): O POS

## 2015-12-27 LAB — URINE MICROSCOPIC-ADD ON
SQUAMOUS EPITHELIAL / LPF: NONE SEEN
WBC, UA: NONE SEEN WBC/hpf (ref 0–5)

## 2015-12-27 LAB — URINALYSIS, ROUTINE W REFLEX MICROSCOPIC
BILIRUBIN URINE: NEGATIVE
GLUCOSE, UA: NEGATIVE mg/dL
KETONES UR: NEGATIVE mg/dL
Leukocytes, UA: NEGATIVE
Nitrite: NEGATIVE
PROTEIN: NEGATIVE mg/dL
Specific Gravity, Urine: 1.016 (ref 1.005–1.030)
pH: 5.5 (ref 5.0–8.0)

## 2015-12-27 LAB — CBC WITH DIFFERENTIAL/PLATELET
BASOS ABS: 0 10*3/uL (ref 0.0–0.1)
BASOS PCT: 0 %
Eosinophils Absolute: 0.3 10*3/uL (ref 0.0–0.7)
Eosinophils Relative: 5 %
HEMATOCRIT: 43.5 % (ref 39.0–52.0)
HEMOGLOBIN: 14.5 g/dL (ref 13.0–17.0)
LYMPHS PCT: 27 %
Lymphs Abs: 1.9 10*3/uL (ref 0.7–4.0)
MCH: 31.5 pg (ref 26.0–34.0)
MCHC: 33.3 g/dL (ref 30.0–36.0)
MCV: 94.4 fL (ref 78.0–100.0)
MONO ABS: 0.7 10*3/uL (ref 0.1–1.0)
Monocytes Relative: 10 %
NEUTROS ABS: 4 10*3/uL (ref 1.7–7.7)
NEUTROS PCT: 58 %
Platelets: 206 10*3/uL (ref 150–400)
RBC: 4.61 MIL/uL (ref 4.22–5.81)
RDW: 12.5 % (ref 11.5–15.5)
WBC: 6.9 10*3/uL (ref 4.0–10.5)

## 2015-12-27 LAB — TYPE AND SCREEN
ABO/RH(D): O POS
ANTIBODY SCREEN: NEGATIVE

## 2015-12-27 LAB — BASIC METABOLIC PANEL
ANION GAP: 7 (ref 5–15)
BUN: 24 mg/dL — ABNORMAL HIGH (ref 6–20)
CALCIUM: 9.5 mg/dL (ref 8.9–10.3)
CO2: 29 mmol/L (ref 22–32)
Chloride: 102 mmol/L (ref 101–111)
Creatinine, Ser: 1.37 mg/dL — ABNORMAL HIGH (ref 0.61–1.24)
GFR, EST NON AFRICAN AMERICAN: 53 mL/min — AB (ref >60)
GLUCOSE: 74 mg/dL (ref 65–99)
POTASSIUM: 4.4 mmol/L (ref 3.5–5.1)
Sodium: 138 mmol/L (ref 135–145)

## 2015-12-27 LAB — PROTIME-INR
INR: 1.12
Prothrombin Time: 14.5 seconds (ref 11.4–15.2)

## 2015-12-27 LAB — APTT: APTT: 27 s (ref 24–36)

## 2015-12-27 NOTE — Pre-Procedure Instructions (Signed)
    Trevor EarthlyJeffrey K Watson  12/27/2015      [facility] [place] - [phone] [place] [address] Phone: [phone] Fax: [phone]    Your procedure is scheduled on Wed., Sept. 20   [facility] at 10:45 A.M.   Call this number if you have problems the morning of surgery:  [phone]   Remember:  Do not eat food or drink liquids after midnight on Tues. Sept. 19    Take these medicines the morning of surgery with A SIP OF WATER : tylenol if needed, albuterol inhaler if needed-bring to hospital, omeprazole (prilosec)            1 week prior to surgery stop NSAID:aspirin, ibuprofen, motrin, advil, meloxicam, Goody's, BC Powders, and vitamins/herbal medicines.   Do not wear jewelry.  Do not wear lotions, powders, or cologne, or deoderant.  Do not shave 48 hours prior to surgery.  Men may shave face and neck.  Do not bring valuables to the hospital.  [facility] is not responsible for any belongings or valuables.  Contacts, dentures or bridgework may not be worn into surgery.  Leave your suitcase in the car.  After surgery it may be brought to your room.  For patients admitted to the hospital, discharge time will be determined by your treatment team.  Patients discharged the day of surgery will not be allowed to drive home.    Special instructions:  Review preparing for surgery handout  Please read over the following fact sheets that you were given. Coughing and Deep Breathing and MRSA Information

## 2015-12-28 LAB — SURGICAL PCR SCREEN
MRSA, PCR: NEGATIVE
Staphylococcus aureus: NEGATIVE

## 2016-01-04 DIAGNOSIS — M1612 Unilateral primary osteoarthritis, left hip: Secondary | ICD-10-CM | POA: Diagnosis present

## 2016-01-04 MED ORDER — CEFAZOLIN SODIUM-DEXTROSE 2-4 GM/100ML-% IV SOLN
2.0000 g | INTRAVENOUS | Status: AC
  Administered 2016-01-05: 2 g via INTRAVENOUS
  Filled 2016-01-04: qty 100

## 2016-01-04 NOTE — H&P (Signed)
TOTAL HIP ADMISSION H&P  Patient is admitted for left total hip arthroplasty.  Subjective:  Chief Complaint: left hip pain  HPI: Trevor Watson, 65 y.o. male, has a history of pain and functional disability in the left hip(s) due to arthritis and patient has failed non-surgical conservative treatments for greater than 12 weeks to include NSAID's and/or analgesics, flexibility and strengthening excercises, use of assistive devices, weight reduction as appropriate and activity modification.  Onset of symptoms was gradual starting 2 years ago with gradually worsening course since that time.The patient noted no past surgery on the left hip(s).  Patient currently rates pain in the left hip at 10 out of 10 with activity. Patient has night pain, worsening of pain with activity and weight bearing, pain that interfers with activities of daily living and pain with passive range of motion. Patient has evidence of subchondral cysts, periarticular osteophytes and joint space narrowing by imaging studies. This condition presents safety issues increasing the risk of falls.  There is no current active infection.  Patient Active Problem List   Diagnosis Date Noted  . Osteoarthritis of left hip 06/15/2014  . Airway hyperreactivity 03/16/2014  . Acid reflux 03/16/2014  . Thyroid nodule 12/03/2013  . Hyperglycemia 12/03/2013  . Recurrent boils 12/03/2013  . Tremor 12/03/2013  . Degenerative joint disease of hand 12/03/2013  . Arthritis of hand, degenerative 12/03/2013  . Anterior neck pain 01/31/2013  . Hypertension 10/11/2011  . Snoring 03/30/2011  . Asthmatic bronchitis 03/30/2011  . INGUINAL HERNIA 03/14/2009  . CHEST PAIN, ATYPICAL 03/14/2009  . GERD 03/01/2009  . IRRITABLE BOWEL SYNDROME 03/01/2009  . 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 Allergy   . Anxiety   . Arthritis   . Asthma   . Bronchitis    recurrent  . GERD (gastroesophageal reflux disease)    . History of inguinal hernia repair   . Hypertension   . IBS (irritable bowel syndrome)   . Recurrent boils   . Thyroid nodule    checked every year has not changed    Past Surgical History:  Procedure Laterality Date  . LAPAROSCOPIC CHOLECYSTECTOMY  1996  . left inguinal hernia repair  age 89  . right inguinl hernia repair w/ mesh  09/07   Dr. Janee Mornhompson    No prescriptions prior to admission.   No Known Allergies  Social History  Substance Use Topics  . Smoking status: Former Smoker    Packs/day: 2.00    Years: 30.00    Types: Cigarettes    Quit date: 04/18/1983  . Smokeless tobacco: Never Used  . Alcohol use 3.6 oz/week    6 Cans of beer per week     Comment: about 6 drinks per week    Family History  Problem Relation Age of Onset  . Breast cancer Mother   . Thyroid cancer Father   . Hypertension Father   . Parkinson's disease Father   . Colon cancer Neg Hx      Review of Systems  Constitutional: Negative.   HENT: Negative.   Eyes: Negative.   Respiratory: Negative.   Cardiovascular:       HTN  Gastrointestinal: Positive for heartburn.  Genitourinary: Negative.   Musculoskeletal: Positive for joint pain.  Skin: Negative.   Neurological: Negative.   Endo/Heme/Allergies: Bruises/bleeds easily.  Psychiatric/Behavioral: Negative.     Objective:  Physical Exam  Constitutional: He is oriented to person, place, and time. He appears well-developed and  well-nourished.  HENT:  Head: Normocephalic and atraumatic.  Eyes: Pupils are equal, round, and reactive to light.  Neck: Normal range of motion.  Cardiovascular: Intact distal pulses.   Respiratory: Effort normal.  Musculoskeletal: He exhibits tenderness.  Internal rotation of the left hip is limited to 20 and causes significant pain external rotation does not.  He walks with a moderate left-sided limp.  Foot tap is negative.  He is neurovascularly intact. The skin over his hip is intact. There is no swelling  or erythema.    Neurological: He is alert and oriented to person, place, and time.  Skin: Skin is warm and dry.  Psychiatric: He has a normal mood and affect. His behavior is normal. Judgment and thought content normal.    Vital signs in last 24 hours:    Labs:   Estimated body mass index is 28.2 kg/m as calculated from the following:   Height as of 12/27/15: 5\' 11"  (1.803 m).   Weight as of 12/27/15: 91.7 kg (202 lb 3.2 oz).   Imaging Review Plain radiographs demonstrate bone-on-bone arthritic changes, subchondral cysts and peripheral osteophytes to the left hip.  The right hip has minimal arthritic changes and a normal thickness of cartilage.   Assessment/Plan:  End stage arthritis, left hip(s)  The patient history, physical examination, clinical judgement of the provider and imaging studies are consistent with end stage degenerative joint disease of the left hip(s) and total hip arthroplasty is deemed medically necessary. The treatment options including medical management, injection therapy, arthroscopy and arthroplasty were discussed at length. The risks and benefits of total hip arthroplasty were presented and reviewed. The risks due to aseptic loosening, infection, stiffness, dislocation/subluxation,  thromboembolic complications and other imponderables were discussed.  The patient acknowledged the explanation, agreed to proceed with the plan and consent was signed. Patient is being admitted for inpatient treatment for surgery, pain control, PT, OT, prophylactic antibiotics, VTE prophylaxis, progressive ambulation and ADL's and discharge planning.The patient is planning to be discharged home with home health services

## 2016-01-05 ENCOUNTER — Ambulatory Visit: Payer: Managed Care, Other (non HMO) | Admitting: Family Medicine

## 2016-01-05 ENCOUNTER — Encounter (HOSPITAL_COMMUNITY): Payer: Self-pay | Admitting: Certified Registered"

## 2016-01-05 ENCOUNTER — Encounter (HOSPITAL_COMMUNITY)
Admission: RE | Disposition: A | Discharge status: Home Health | Payer: Self-pay | Source: Ambulatory Visit | Attending: Orthopedic Surgery

## 2016-01-05 ENCOUNTER — Inpatient Hospital Stay (HOSPITAL_COMMUNITY): Payer: Managed Care, Other (non HMO)

## 2016-01-05 ENCOUNTER — Inpatient Hospital Stay (HOSPITAL_COMMUNITY): Payer: Managed Care, Other (non HMO) | Admitting: Anesthesiology

## 2016-01-05 ENCOUNTER — Inpatient Hospital Stay (HOSPITAL_COMMUNITY)
Admission: RE | Admit: 2016-01-05 | Discharge: 2016-01-07 | DRG: 470 | Disposition: A | Discharge status: Home Health | Payer: Managed Care, Other (non HMO) | Source: Ambulatory Visit | Attending: Orthopedic Surgery | Admitting: Orthopedic Surgery

## 2016-01-05 DIAGNOSIS — I10 Essential (primary) hypertension: Secondary | ICD-10-CM | POA: Diagnosis present

## 2016-01-05 DIAGNOSIS — D62 Acute posthemorrhagic anemia: Secondary | ICD-10-CM | POA: Diagnosis not present

## 2016-01-05 DIAGNOSIS — Z87891 Personal history of nicotine dependence: Secondary | ICD-10-CM

## 2016-01-05 DIAGNOSIS — Z8249 Family history of ischemic heart disease and other diseases of the circulatory system: Secondary | ICD-10-CM

## 2016-01-05 DIAGNOSIS — K219 Gastro-esophageal reflux disease without esophagitis: Secondary | ICD-10-CM | POA: Diagnosis present

## 2016-01-05 DIAGNOSIS — K589 Irritable bowel syndrome without diarrhea: Secondary | ICD-10-CM | POA: Diagnosis present

## 2016-01-05 DIAGNOSIS — Z419 Encounter for procedure for purposes other than remedying health state, unspecified: Secondary | ICD-10-CM

## 2016-01-05 DIAGNOSIS — F419 Anxiety disorder, unspecified: Secondary | ICD-10-CM | POA: Diagnosis present

## 2016-01-05 DIAGNOSIS — M1612 Unilateral primary osteoarthritis, left hip: Secondary | ICD-10-CM | POA: Diagnosis present

## 2016-01-05 DIAGNOSIS — M25552 Pain in left hip: Secondary | ICD-10-CM | POA: Diagnosis present

## 2016-01-05 HISTORY — DX: Nausea with vomiting, unspecified: Z98.890

## 2016-01-05 HISTORY — DX: Other complications of anesthesia, initial encounter: T88.59XA

## 2016-01-05 HISTORY — PX: TOTAL HIP ARTHROPLASTY: SHX124

## 2016-01-05 HISTORY — DX: Nausea with vomiting, unspecified: R11.2

## 2016-01-05 HISTORY — DX: Adverse effect of unspecified anesthetic, initial encounter: T41.45XA

## 2016-01-05 SURGERY — ARTHROPLASTY, HIP, TOTAL, ANTERIOR APPROACH
Anesthesia: General | Laterality: Left

## 2016-01-05 MED ORDER — KETAMINE HCL 10 MG/ML IJ SOLN
INTRAMUSCULAR | Status: DC | PRN
  Administered 2016-01-05 (×2): 20 mg via INTRAVENOUS

## 2016-01-05 MED ORDER — PHENOL 1.4 % MT LIQD: 1.0000 | OROMUCOSAL | Status: DC | PRN

## 2016-01-05 MED ORDER — ASPIRIN EC 325 MG PO TBEC
325.0000 mg | DELAYED_RELEASE_TABLET | Freq: Every day | ORAL | Status: DC
  Administered 2016-01-06 – 2016-01-07 (×2): 325 mg via ORAL
  Filled 2016-01-05 (×2): qty 1

## 2016-01-05 MED ORDER — ALBUTEROL SULFATE (2.5 MG/3ML) 0.083% IN NEBU: 2.5000 mg | INHALATION_SOLUTION | Freq: Four times a day (QID) | RESPIRATORY_TRACT | Status: DC | PRN

## 2016-01-05 MED ORDER — PROMETHAZINE HCL 25 MG/ML IJ SOLN: 6.2500 mg | INTRAMUSCULAR | Status: DC | PRN

## 2016-01-05 MED ORDER — POLYETHYLENE GLYCOL 3350 17 G PO PACK: 17.0000 g | PACK | Freq: Every day | ORAL | Status: DC | PRN

## 2016-01-05 MED ORDER — ROCURONIUM BROMIDE 10 MG/ML (PF) SYRINGE
PREFILLED_SYRINGE | INTRAVENOUS | Status: AC
  Filled 2016-01-05: qty 10

## 2016-01-05 MED ORDER — ACETAMINOPHEN 325 MG PO TABS: 650.0000 mg | ORAL_TABLET | Freq: Four times a day (QID) | ORAL | Status: DC | PRN

## 2016-01-05 MED ORDER — SUGAMMADEX SODIUM 200 MG/2ML IV SOLN
INTRAVENOUS | Status: AC
  Filled 2016-01-05: qty 2

## 2016-01-05 MED ORDER — MENTHOL 3 MG MT LOZG: 1.0000 | LOZENGE | OROMUCOSAL | Status: DC | PRN

## 2016-01-05 MED ORDER — KETAMINE HCL-SODIUM CHLORIDE 100-0.9 MG/10ML-% IV SOSY
PREFILLED_SYRINGE | INTRAVENOUS | Status: AC
Start: 2016-01-05 — End: 2016-01-05
  Filled 2016-01-05: qty 10

## 2016-01-05 MED ORDER — ONDANSETRON HCL 4 MG/2ML IJ SOLN
INTRAMUSCULAR | Status: AC
  Filled 2016-01-05: qty 2

## 2016-01-05 MED ORDER — METOCLOPRAMIDE HCL 5 MG PO TABS
5.0000 mg | ORAL_TABLET | Freq: Three times a day (TID) | ORAL | Status: DC | PRN
  Administered 2016-01-06: 10 mg via ORAL
  Filled 2016-01-05: qty 2

## 2016-01-05 MED ORDER — ALUMINUM HYDROXIDE GEL 320 MG/5ML PO SUSP
15.0000 mL | ORAL | Status: DC | PRN
  Filled 2016-01-05: qty 30

## 2016-01-05 MED ORDER — METHOCARBAMOL 1000 MG/10ML IJ SOLN
500.0000 mg | Freq: Four times a day (QID) | INTRAVENOUS | Status: DC | PRN
  Filled 2016-01-05: qty 5

## 2016-01-05 MED ORDER — LIDOCAINE 2% (20 MG/ML) 5 ML SYRINGE
INTRAMUSCULAR | Status: AC
Start: 2016-01-05 — End: 2016-01-05
  Filled 2016-01-05: qty 5

## 2016-01-05 MED ORDER — METOCLOPRAMIDE HCL 5 MG/ML IJ SOLN
5.0000 mg | Freq: Three times a day (TID) | INTRAMUSCULAR | Status: DC | PRN
  Administered 2016-01-06: 10 mg via INTRAVENOUS
  Filled 2016-01-05: qty 2

## 2016-01-05 MED ORDER — CHLORHEXIDINE GLUCONATE 4 % EX LIQD: 60.0000 mL | Freq: Once | CUTANEOUS | Status: DC

## 2016-01-05 MED ORDER — LACTATED RINGERS IV SOLN
Freq: Once | INTRAVENOUS | Status: AC
  Administered 2016-01-05: 12:00:00 via INTRAVENOUS

## 2016-01-05 MED ORDER — TRANEXAMIC ACID 1000 MG/10ML IV SOLN
1000.0000 mg | INTRAVENOUS | Status: AC
  Administered 2016-01-05: 1000 mg via INTRAVENOUS
  Filled 2016-01-05: qty 10

## 2016-01-05 MED ORDER — KCL IN DEXTROSE-NACL 20-5-0.45 MEQ/L-%-% IV SOLN
INTRAVENOUS | Status: DC
  Administered 2016-01-05 – 2016-01-06 (×2): via INTRAVENOUS
  Filled 2016-01-05 (×3): qty 1000

## 2016-01-05 MED ORDER — ALBUTEROL SULFATE HFA 108 (90 BASE) MCG/ACT IN AERS: 2.0000 | INHALATION_SPRAY | Freq: Four times a day (QID) | RESPIRATORY_TRACT | Status: DC | PRN

## 2016-01-05 MED ORDER — METHOCARBAMOL 500 MG PO TABS
500.0000 mg | ORAL_TABLET | Freq: Four times a day (QID) | ORAL | Status: DC | PRN
  Administered 2016-01-05 – 2016-01-06 (×2): 500 mg via ORAL
  Filled 2016-01-05 (×2): qty 1

## 2016-01-05 MED ORDER — ONDANSETRON HCL 4 MG/2ML IJ SOLN
INTRAMUSCULAR | Status: DC | PRN
  Administered 2016-01-05: 4 mg via INTRAVENOUS

## 2016-01-05 MED ORDER — ROCURONIUM BROMIDE 100 MG/10ML IV SOLN
INTRAVENOUS | Status: DC | PRN
  Administered 2016-01-05: 50 mg via INTRAVENOUS

## 2016-01-05 MED ORDER — HYDROCHLOROTHIAZIDE 25 MG PO TABS
25.0000 mg | ORAL_TABLET | Freq: Every day | ORAL | Status: DC
  Administered 2016-01-05 – 2016-01-07 (×3): 25 mg via ORAL
  Filled 2016-01-05 (×3): qty 1

## 2016-01-05 MED ORDER — PANTOPRAZOLE SODIUM 20 MG PO TBEC
20.0000 mg | DELAYED_RELEASE_TABLET | Freq: Every day | ORAL | Status: DC
  Administered 2016-01-06 – 2016-01-07 (×2): 20 mg via ORAL
  Filled 2016-01-05 (×2): qty 1

## 2016-01-05 MED ORDER — FENTANYL CITRATE (PF) 100 MCG/2ML IJ SOLN
25.0000 ug | INTRAMUSCULAR | Status: DC | PRN
  Administered 2016-01-05 (×2): 50 ug via INTRAVENOUS

## 2016-01-05 MED ORDER — FENTANYL CITRATE (PF) 100 MCG/2ML IJ SOLN
INTRAMUSCULAR | Status: AC
  Filled 2016-01-05: qty 2

## 2016-01-05 MED ORDER — BISACODYL 5 MG PO TBEC: 5.0000 mg | DELAYED_RELEASE_TABLET | Freq: Every day | ORAL | Status: DC | PRN

## 2016-01-05 MED ORDER — OXYCODONE HCL 5 MG PO TABS
5.0000 mg | ORAL_TABLET | ORAL | Status: DC | PRN
  Administered 2016-01-05 – 2016-01-07 (×12): 10 mg via ORAL
  Filled 2016-01-05 (×12): qty 2

## 2016-01-05 MED ORDER — OXYCODONE-ACETAMINOPHEN 5-325 MG PO TABS: 1.0000 | ORAL_TABLET | ORAL | 0 refills | Status: DC | PRN

## 2016-01-05 MED ORDER — FENTANYL CITRATE (PF) 100 MCG/2ML IJ SOLN
INTRAMUSCULAR | Status: DC | PRN
Start: 2016-01-05 — End: 2016-01-05
  Administered 2016-01-05 (×3): 100 ug via INTRAVENOUS

## 2016-01-05 MED ORDER — TRANEXAMIC ACID 1000 MG/10ML IV SOLN
2000.0000 mg | Freq: Once | INTRAVENOUS | Status: AC
  Administered 2016-01-05: 2000 mg via TOPICAL
  Filled 2016-01-05: qty 20

## 2016-01-05 MED ORDER — FENTANYL CITRATE (PF) 100 MCG/2ML IJ SOLN
INTRAMUSCULAR | Status: AC
  Filled 2016-01-05: qty 4

## 2016-01-05 MED ORDER — HYDROMORPHONE HCL 1 MG/ML IJ SOLN: 1.0000 mg | INTRAMUSCULAR | Status: DC | PRN

## 2016-01-05 MED ORDER — GLYCOPYRROLATE 0.2 MG/ML IJ SOLN
INTRAMUSCULAR | Status: DC | PRN
  Administered 2016-01-05: 0.2 mg via INTRAVENOUS

## 2016-01-05 MED ORDER — SUGAMMADEX SODIUM 200 MG/2ML IV SOLN
INTRAVENOUS | Status: DC | PRN
  Administered 2016-01-05: 190 mg via INTRAVENOUS

## 2016-01-05 MED ORDER — LOSARTAN POTASSIUM 50 MG PO TABS
100.0000 mg | ORAL_TABLET | Freq: Every day | ORAL | Status: DC
  Administered 2016-01-05 – 2016-01-07 (×3): 100 mg via ORAL
  Filled 2016-01-05 (×3): qty 2

## 2016-01-05 MED ORDER — MIDAZOLAM HCL 2 MG/2ML IJ SOLN
INTRAMUSCULAR | Status: DC | PRN
  Administered 2016-01-05: 2 mg via INTRAVENOUS

## 2016-01-05 MED ORDER — ONDANSETRON HCL 4 MG/2ML IJ SOLN
4.0000 mg | Freq: Four times a day (QID) | INTRAMUSCULAR | Status: DC | PRN
  Administered 2016-01-06: 4 mg via INTRAVENOUS
  Filled 2016-01-05: qty 2

## 2016-01-05 MED ORDER — ASPIRIN EC 325 MG PO TBEC: 325.0000 mg | DELAYED_RELEASE_TABLET | Freq: Two times a day (BID) | ORAL | 0 refills | Status: DC

## 2016-01-05 MED ORDER — DOCUSATE SODIUM 100 MG PO CAPS
100.0000 mg | ORAL_CAPSULE | Freq: Two times a day (BID) | ORAL | Status: DC
  Administered 2016-01-05 – 2016-01-07 (×4): 100 mg via ORAL
  Filled 2016-01-05 (×4): qty 1

## 2016-01-05 MED ORDER — PROPOFOL 10 MG/ML IV BOLUS
INTRAVENOUS | Status: AC
Start: 2016-01-05 — End: 2016-01-05
  Filled 2016-01-05: qty 20

## 2016-01-05 MED ORDER — MIDAZOLAM HCL 2 MG/2ML IJ SOLN
INTRAMUSCULAR | Status: AC
Start: 2016-01-05 — End: 2016-01-05
  Filled 2016-01-05: qty 2

## 2016-01-05 MED ORDER — FLEET ENEMA 7-19 GM/118ML RE ENEM: 1.0000 | ENEMA | Freq: Once | RECTAL | Status: DC | PRN

## 2016-01-05 MED ORDER — BUPIVACAINE-EPINEPHRINE 0.25% -1:200000 IJ SOLN
INTRAMUSCULAR | Status: DC | PRN
  Administered 2016-01-05: 50 mL

## 2016-01-05 MED ORDER — METHOCARBAMOL 500 MG PO TABS
ORAL_TABLET | ORAL | Status: AC
  Administered 2016-01-05: 500 mg
  Filled 2016-01-05: qty 1

## 2016-01-05 MED ORDER — BUPIVACAINE-EPINEPHRINE (PF) 0.25% -1:200000 IJ SOLN
INTRAMUSCULAR | Status: AC
Start: 2016-01-05 — End: 2016-01-05
  Filled 2016-01-05: qty 30

## 2016-01-05 MED ORDER — PROPOFOL 10 MG/ML IV BOLUS
INTRAVENOUS | Status: DC | PRN
  Administered 2016-01-05: 200 mg via INTRAVENOUS

## 2016-01-05 MED ORDER — DEXAMETHASONE SODIUM PHOSPHATE 10 MG/ML IJ SOLN
10.0000 mg | Freq: Once | INTRAMUSCULAR | Status: AC
  Administered 2016-01-06: 10 mg via INTRAVENOUS
  Filled 2016-01-05: qty 1

## 2016-01-05 MED ORDER — BUPIVACAINE LIPOSOME 1.3 % IJ SUSP
20.0000 mL | INTRAMUSCULAR | Status: AC
  Administered 2016-01-05: 20 mL
  Filled 2016-01-05: qty 20

## 2016-01-05 MED ORDER — OXYCODONE HCL 5 MG PO TABS
ORAL_TABLET | ORAL | Status: AC
  Administered 2016-01-05: 5 mg
  Filled 2016-01-05: qty 1

## 2016-01-05 MED ORDER — 0.9 % SODIUM CHLORIDE (POUR BTL) OPTIME
TOPICAL | Status: DC | PRN
  Administered 2016-01-05: 1000 mL

## 2016-01-05 MED ORDER — METHOCARBAMOL 500 MG PO TABS: 500.0000 mg | ORAL_TABLET | Freq: Two times a day (BID) | ORAL | 0 refills | Status: DC

## 2016-01-05 MED ORDER — DIPHENHYDRAMINE HCL 12.5 MG/5ML PO ELIX
12.5000 mg | ORAL_SOLUTION | ORAL | Status: DC | PRN
Start: 2016-01-05 — End: 2016-01-07

## 2016-01-05 MED ORDER — LACTATED RINGERS IV SOLN
INTRAVENOUS | Status: DC | PRN
  Administered 2016-01-05 (×2): via INTRAVENOUS

## 2016-01-05 MED ORDER — OMEPRAZOLE MAGNESIUM 20 MG PO TBEC: 20.0000 mg | DELAYED_RELEASE_TABLET | Freq: Every day | ORAL | Status: DC

## 2016-01-05 MED ORDER — ACETAMINOPHEN 650 MG RE SUPP: 650.0000 mg | Freq: Four times a day (QID) | RECTAL | Status: DC | PRN

## 2016-01-05 MED ORDER — ONDANSETRON HCL 4 MG PO TABS: 4.0000 mg | ORAL_TABLET | Freq: Four times a day (QID) | ORAL | Status: DC | PRN

## 2016-01-05 MED ORDER — LOSARTAN POTASSIUM-HCTZ 100-25 MG PO TABS: 1.0000 | ORAL_TABLET | Freq: Every day | ORAL | Status: DC

## 2016-01-05 MED ORDER — LIDOCAINE HCL (CARDIAC) 20 MG/ML IV SOLN
INTRAVENOUS | Status: DC | PRN
  Administered 2016-01-05: 40 mg via INTRAVENOUS

## 2016-01-05 SURGICAL SUPPLY — 44 items
BLADE SURG ROTATE 9660 (MISCELLANEOUS) IMPLANT
CAPT HIP TOTAL 2 ×3 IMPLANT
COVER PERINEAL POST (MISCELLANEOUS) ×3 IMPLANT
COVER SURGICAL LIGHT HANDLE (MISCELLANEOUS) ×3 IMPLANT
DRAPE C-ARM 42X72 X-RAY (DRAPES) ×3 IMPLANT
DRAPE STERI IOBAN 125X83 (DRAPES) ×3 IMPLANT
DRAPE U-SHAPE 47X51 STRL (DRAPES) ×6 IMPLANT
DRSG AQUACEL AG ADV 3.5X10 (GAUZE/BANDAGES/DRESSINGS) ×3 IMPLANT
DURAPREP 26ML APPLICATOR (WOUND CARE) ×3 IMPLANT
ELECT BLADE 4.0 EZ CLEAN MEGAD (MISCELLANEOUS) ×3
ELECT REM PT RETURN 9FT ADLT (ELECTROSURGICAL) ×3
ELECTRODE BLDE 4.0 EZ CLN MEGD (MISCELLANEOUS) ×1 IMPLANT
ELECTRODE REM PT RTRN 9FT ADLT (ELECTROSURGICAL) ×1 IMPLANT
FACESHIELD WRAPAROUND (MASK) ×6 IMPLANT
GLOVE BIO SURGEON STRL SZ7.5 (GLOVE) ×3 IMPLANT
GLOVE BIO SURGEON STRL SZ8.5 (GLOVE) ×3 IMPLANT
GLOVE BIOGEL PI IND STRL 8 (GLOVE) ×1 IMPLANT
GLOVE BIOGEL PI IND STRL 9 (GLOVE) ×1 IMPLANT
GLOVE BIOGEL PI INDICATOR 8 (GLOVE) ×2
GLOVE BIOGEL PI INDICATOR 9 (GLOVE) ×2
GOWN STRL REUS W/ TWL LRG LVL3 (GOWN DISPOSABLE) ×1 IMPLANT
GOWN STRL REUS W/ TWL XL LVL3 (GOWN DISPOSABLE) ×2 IMPLANT
GOWN STRL REUS W/TWL LRG LVL3 (GOWN DISPOSABLE) ×2
GOWN STRL REUS W/TWL XL LVL3 (GOWN DISPOSABLE) ×6
KIT BASIN OR (CUSTOM PROCEDURE TRAY) ×3 IMPLANT
KIT ROOM TURNOVER OR (KITS) ×3 IMPLANT
MANIFOLD NEPTUNE II (INSTRUMENTS) ×3 IMPLANT
NEEDLE 22X1 1/2 OR ONLY (MISCELLANEOUS) ×4
NEEDLE 22X1.5 STRL (OR ONLY) (MISCELLANEOUS) ×2 IMPLANT
NS IRRIG 1000ML POUR BTL (IV SOLUTION) ×3 IMPLANT
PACK TOTAL JOINT (CUSTOM PROCEDURE TRAY) ×3 IMPLANT
PAD ARMBOARD 7.5X6 YLW CONV (MISCELLANEOUS) ×6 IMPLANT
SAW OSC TIP CART 19.5X105X1.3 (SAW) ×3 IMPLANT
SUT VIC AB 1 CTX 36 (SUTURE) ×3
SUT VIC AB 1 CTX36XBRD ANBCTR (SUTURE) ×1 IMPLANT
SUT VIC AB 2-0 CT1 27 (SUTURE) ×6
SUT VIC AB 2-0 CT1 TAPERPNT 27 (SUTURE) ×2 IMPLANT
SUT VIC AB 3-0 PS2 18 (SUTURE) ×3
SUT VIC AB 3-0 PS2 18XBRD (SUTURE) ×1 IMPLANT
SYR CONTROL 10ML LL (SYRINGE) ×6 IMPLANT
TOWEL OR 17X24 6PK STRL BLUE (TOWEL DISPOSABLE) ×3 IMPLANT
TOWEL OR 17X26 10 PK STRL BLUE (TOWEL DISPOSABLE) ×3 IMPLANT
TRAY FOLEY CATH 14FR (SET/KITS/TRAYS/PACK) IMPLANT
WATER STERILE IRR 1000ML POUR (IV SOLUTION) ×3 IMPLANT

## 2016-01-05 NOTE — Anesthesia Postprocedure Evaluation (Signed)
Anesthesia Post Note  Patient: Trevor Watson  Procedure(s) Performed: Procedure(s) (LRB): TOTAL HIP ARTHROPLASTY ANTERIOR APPROACH (Left)  Patient location during evaluation: PACU Anesthesia Type: General Level of consciousness: awake and alert Pain management: pain level controlled Vital Signs Assessment: post-procedure vital signs reviewed and stable Respiratory status: spontaneous breathing, nonlabored ventilation, respiratory function stable and patient connected to nasal cannula oxygen Cardiovascular status: blood pressure returned to baseline and stable Postop Assessment: no signs of nausea or vomiting Anesthetic complications: no    Last Vitals:  Vitals:   01/05/16 1800 01/05/16 1811  BP:  130/71  Pulse: 64 62  Resp: 15 15  Temp:      Last Pain:  Vitals:   01/05/16 1650  TempSrc:   PainSc: 5                  Kennieth RadFitzgerald, Jonnathan Birman E

## 2016-01-05 NOTE — Anesthesia Preprocedure Evaluation (Signed)
Anesthesia Evaluation  Patient identified by MRN, date of birth, ID band Patient awake    Reviewed: Allergy & Precautions, NPO status , Patient's Chart, lab work & pertinent test results  History of Anesthesia Complications Negative for: history of anesthetic complications  Airway Mallampati: II  TM Distance: >3 FB Neck ROM: Full    Dental  (+) Teeth Intact, Dental Advisory Given,    Pulmonary asthma , former smoker,    Pulmonary exam normal breath sounds clear to auscultation       Cardiovascular hypertension, Pt. on medications (-) angina(-) CAD, (-) Past MI and (-) CHF Normal cardiovascular exam Rhythm:Regular Rate:Normal     Neuro/Psych PSYCHIATRIC DISORDERS Anxiety negative neurological ROS  negative psych ROS   GI/Hepatic Neg liver ROS, GERD  Medicated,  Endo/Other  negative endocrine ROS  Renal/GU negative Renal ROS     Musculoskeletal  (+) Arthritis , Osteoarthritis,    Abdominal   Peds  Hematology negative hematology ROS (+)   Anesthesia Other Findings Day of surgery medications reviewed with the patient.  Reproductive/Obstetrics                             Anesthesia Physical Anesthesia Plan  ASA: II  Anesthesia Plan: General   Post-op Pain Management:    Induction: Intravenous  Airway Management Planned: Oral ETT  Additional Equipment:   Intra-op Plan:   Post-operative Plan: Extubation in OR  Informed Consent: I have reviewed the patients History and Physical, chart, labs and discussed the procedure including the risks, benefits and alternatives for the proposed anesthesia with the patient or authorized representative who has indicated his/her understanding and acceptance.   Dental advisory given  Plan Discussed with: CRNA  Anesthesia Plan Comments: (Risks/benefits of general anesthesia discussed with patient including risk of damage to teeth, lips, gum, and  tongue, nausea/vomiting, allergic reactions to medications, and the possibility of heart attack, stroke and death.  All patient questions answered.  Patient wishes to proceed.)        Anesthesia Quick Evaluation

## 2016-01-05 NOTE — Op Note (Signed)
OPERATIVE REPORT    DATE OF PROCEDURE:  01/05/2016       PREOPERATIVE DIAGNOSIS:  LEFT HIP OSTEOARTHRITIS                                                          POSTOPERATIVE DIAGNOSIS:  LEFT HIP OSTEOARTHRITIS                                                           PROCEDURE: Anterior L total hip arthroplasty using a 52 mm DePuy Pinnacle  Cup, [employer], 0-degree polyethylene liner, a +8.5 36 mm ceramic head, a 7 Depuy Triloc stem   SURGEON: Gean Birchwood J    ASSISTANT:   Eric K. [employer]  (present throughout entire procedure and necessary for timely completion of the procedure)   ANESTHESIA: GET BLOOD LOSS: 300 FLUID REPLACEMENT: 1500 crystalloid Antibiotic: 2gm ancef Tranexamic Acid: 1gm iv, 2gm topical COMPLICATIONS: none    INDICATIONS FOR PROCEDURE: A 65 y.o. year-old With  LEFT HIP OSTEOARTHRITIS   for 2 years, x-rays show bone-on-bone arthritic changes, and osteophytes. Despite conservative measures with observation, anti-inflammatory medicine, narcotics, use of a cane, has severe unremitting pain and can ambulate only a few blocks before resting. Patient desires elective L total hip arthroplasty to decrease pain and increase function. The risks, benefits, and alternatives were discussed at length including but not limited to the risks of infection, bleeding, nerve injury, stiffness, blood clots, the need for revision surgery, cardiopulmonary complications, among others, and they were willing to proceed. Questions answered     PROCEDURE IN DETAIL: The patient was identified by armband,  received preoperative IV antibiotics in the holding area at [facility], taken to the operating room , appropriate anesthetic monitors  were attached and  anesthesia was induced with the patienton the gurney. The HANA boots were applied to the feet and he was then transferred to the HANA table with a peroneal post and support underneath the non-operative le, which  was locked in 5 lb traction. Theoperative lower extremity was then prepped and draped in the usual sterile fashion from just above the iliac crest to the knee. And a timeout procedure was performed. We then made a 12 cm incision along the interval at the leading edge of the tensor fascia lata of starting at 2 cm lateral to and 2 cm distal to the ASIS. Small bleeders in the skin and subcutaneous tissue identified and cauterized we dissected down to the fascia and made an incision in the fascia allowing [place] to elevate the fascia of the tensor muscle and exploited the interval between the rectus and the tensor fascia lata. A Hohmann retractor was then placed along the superior neck of the femur and a Cobra retractor along the inferior neck of the femur we teed the capsule starting out at the superior anterior aspect of the acetabulum going distally and made the T along the neck both leaflets of the T were tagged with #2 Ethibond suture. Cobra retractors were then placed along the inferior and superior neck allowing [place] to perform a standard neck cut and removed  the femoral head with a power corkscrew. We then placed a right angle Hohmann retractor along the anterior aspect of the acetabulum a spiked Cobra in the cotyloid notch and posteriorly a Muelller retractor. We then sequentially reamed up to a 51 mm basket reamer obtaining good coverage in all quadrants, verified by C-arm imaging. Under C-arm control with and hammered into place a 52 mm Pinnacle cup in 45 of abduction and 15 of anteversion. The cup seated nicely and required no supplemental screws. We then placed a central hole Eliminator and a 0 polyethylene liner. The foot was then externally rotated to 110, the HANA elevator was placed around the flare of the greater trochanter and the limb was extended and abducted delivering the proximal femur up into the wound. A medium Hohmann retractor was placed over the greater trochanter and a Mueller retractor along  the posterior femoral neck completing the exposure. We then performed releases superiorly and and inferiorly of the capsule going back to the pirformis fossa superiorly and to the lesser trochanter inferiorly. We then entered the proximal femur with the box cutting offset chisel followed by, a canal sounder, the chili pepper and broaching up to a 7 broach. This seated nicely and we reamed the calcar. A trial reduction was performed with a 8.5 mm 36 mm head.The limb lengths were excellent the hip was stable in 90 of external rotation. At this point the trial components removed and we hammered into place a # 7 Tri-Lock stem with [employer] coating. This was a Hi offset stem and a + 8.5 36 mm ceramic ball was then hammered into place the hip was reduced and final C-arm images obtained. The wound was thoroughly irrigated with normal saline solution. We repaired the ant capsule and the tensor fascia lot a with running 0 vicryl suture. the subcutaneous tissue was closed with 2-0 and 3-0 Vicryl suture followed by an Aquacil dressing. At this point the patient was awaken and transferred to hospital gurney without difficulty. The subcutaneous tissue with 0 and 2-0 undyed Vicryl suture and the skin with running  3-0 vicryl subcuticular suture. Aquacil dressing was applied. The patient was then unclamped, rolled supine, awaken extubated and taken to recovery room without difficulty in stable condition.   Nello Corro J 01/05/2016, 3:03 PM

## 2016-01-05 NOTE — Transfer of Care (Signed)
Immediate Anesthesia Transfer of Care Note  Patient: Trevor Watson  Procedure(s) Performed: Procedure(s): TOTAL HIP ARTHROPLASTY ANTERIOR APPROACH (Left)  Patient Location: PACU  Anesthesia Type:General  Level of Consciousness: awake, alert  and patient cooperative  Airway & Oxygen Therapy: Patient Spontanous Breathing and Patient connected to face mask oxygen  Post-op Assessment: Report given to RN and Post -op Vital signs reviewed and stable  Post vital signs: Reviewed and stable  Last Vitals:  Vitals:   01/05/16 1105 01/05/16 1155  BP: (!) 187/95 (!) 142/82  Pulse: 72   Resp: 20   Temp: 36.9 C     Last Pain:  Vitals:   01/05/16 1105  TempSrc: Oral         Complications: No apparent anesthesia complications

## 2016-01-05 NOTE — Anesthesia Procedure Notes (Signed)
Procedure Name: Intubation Date/Time: 01/05/2016 1:42 PM Performed by: Marcene DuosFITZGERALD, ROBERT Pre-anesthesia Checklist: Patient identified, Emergency Drugs available, Suction available and Patient being monitored Patient Re-evaluated:Patient Re-evaluated prior to inductionOxygen Delivery Method: Circle system utilized Preoxygenation: Pre-oxygenation with 100% oxygen Intubation Type: IV induction Laryngoscope Size: Mac and 3 Grade View: Grade I Tube type: Oral Number of attempts: 1 Placement Confirmation: ETT inserted through vocal cords under direct vision,  positive ETCO2 and breath sounds checked- equal and bilateral Secured at: 23 cm Dental Injury: Teeth and Oropharynx as per pre-operative assessment

## 2016-01-05 NOTE — Discharge Instructions (Signed)

## 2016-01-05 NOTE — Interval H&P Note (Signed)
   History reviewed, patient examined, no change in status, stable for surgery.  

## 2016-01-06 ENCOUNTER — Encounter (HOSPITAL_COMMUNITY): Payer: Self-pay | Admitting: Orthopedic Surgery

## 2016-01-06 LAB — BASIC METABOLIC PANEL
ANION GAP: 9 (ref 5–15)
BUN: 18 mg/dL (ref 6–20)
CO2: 27 mmol/L (ref 22–32)
Calcium: 9.1 mg/dL (ref 8.9–10.3)
Chloride: 99 mmol/L — ABNORMAL LOW (ref 101–111)
Creatinine, Ser: 1.36 mg/dL — ABNORMAL HIGH (ref 0.61–1.24)
GFR, EST NON AFRICAN AMERICAN: 54 mL/min — AB (ref >60)
GLUCOSE: 132 mg/dL — AB (ref 65–99)
Potassium: 4.8 mmol/L (ref 3.5–5.1)
SODIUM: 135 mmol/L (ref 135–145)

## 2016-01-06 LAB — CBC
HEMATOCRIT: 38.1 % — AB (ref 39.0–52.0)
HEMOGLOBIN: 12.4 g/dL — AB (ref 13.0–17.0)
MCH: 30.5 pg (ref 26.0–34.0)
MCHC: 32.5 g/dL (ref 30.0–36.0)
MCV: 93.8 fL (ref 78.0–100.0)
Platelets: 195 10*3/uL (ref 150–400)
RBC: 4.06 MIL/uL — ABNORMAL LOW (ref 4.22–5.81)
RDW: 12.3 % (ref 11.5–15.5)
WBC: 11 10*3/uL — AB (ref 4.0–10.5)

## 2016-01-06 NOTE — Progress Notes (Signed)
PATIENT ID: Trevor Watson  MRN: [id number]  DOB/AGE:  Nov 10, 1950 / 65 y.o.  1 Day Post-Op Procedure(s) (LRB): TOTAL HIP ARTHROPLASTY ANTERIOR APPROACH (Left)    PROGRESS NOTE Subjective: Patient is alert, oriented, no Nausea, no Vomiting, yes passing gas, . Taking PO with small bites. Denies SOB, Chest or Calf Pain. Using Incentive Spirometer, PAS in place. Ambulate WBAT on operative extremity Patient reports pain as  4/10  .    Objective: Vital signs in last 24 hours: Vitals:   01/05/16 1845 01/05/16 2013 01/06/16 0023 01/06/16 0400  BP: 137/72 (!) 154/61 138/64 128/65  Pulse: 66 66 64 63  Resp:  16 18 16   Temp: 98 F (36.7 C) 99.7 F (37.6 C) 98.9 F (37.2 C) 98.4 F (36.9 C)  TempSrc:  Oral Oral Oral  SpO2: 100% 97% 99% 99%      Intake/Output from previous day: I/O last 3 completed shifts: In: 2000 [I.V.:2000] Out: 975 [Urine:825; Blood:150]   Intake/Output this shift: No intake/output data recorded.   LABORATORY DATA:  Recent Labs  01/06/16 0609  WBC 11.0*  HGB 12.4*  HCT 38.1*  PLT 195  NA 135  K 4.8  CL 99*  CO2 27  BUN 18  CREATININE 1.36*  GLUCOSE 132*  CALCIUM 9.1    Examination: Neurologically intact Neurovascular intact Sensation intact distally Intact pulses distally Dorsiflexion/Plantar flexion intact Incision: dressing C/D/I No cellulitis present Compartment soft} XR AP&Lat of hip shows well placed\fixed THA  Assessment:   1 Day Post-Op Procedure(s) (LRB): TOTAL HIP ARTHROPLASTY ANTERIOR APPROACH (Left) ADDITIONAL DIAGNOSIS:  Expected Acute Blood Loss Anemia, Hypertension  Plan: PT/OT WBAT, THA  DVT Prophylaxis: SCDx72 hrs, ASA 325 mg BID x 2 weeks  DISCHARGE PLAN: Home  DISCHARGE NEEDS: HHPT, HHRN, Walker and 3-in-1 comode seat

## 2016-01-06 NOTE — Evaluation (Signed)
Physical Therapy Evaluation Patient Details Name: Trevor Watson MRN: [id number] DOB: April 21, 1950 Today's Date: 01/06/2016   History of Present Illness  Pt is a 65 y/o male s/p L THA (anterior approach). PMH including but not limited to HTN.  Clinical Impression  Pt presented sitting OOB in recliner when PT entered room. Prior to admission, pt stated that he was independent with all functional mobility. Pt moving well during evaluation session and PT recommendations are for pt to d/c home with [facility] PT. Pt would continue to benefit from skilled physical therapy services at this time while admitted and after d/c to address his below listed limitations in order to improve his overall safety and independence with functional mobility.     Follow Up Recommendations Home health PT;Supervision for mobility/OOB    Equipment Recommendations  None recommended by PT    Recommendations for Other Services       Precautions / Restrictions Precautions Precautions: Fall Restrictions Weight Bearing Restrictions: Yes LLE Weight Bearing: Weight bearing as tolerated      Mobility  Bed Mobility               General bed mobility comments: pt sitting OOB in recliner when PT entered room  Transfers Overall transfer level: Needs assistance Equipment used: Rolling walker (2 wheeled) Transfers: Sit to/from Stand Sit to Stand: Min guard         General transfer comment: pt required increased time and VC'ing for bilateral hand placement  Ambulation/Gait Ambulation/Gait assistance: Min guard Ambulation Distance (Feet): 150 Feet Assistive device: Rolling walker (2 wheeled) Gait Pattern/deviations: Step-through pattern;Decreased step length - right;Decreased stance time - left;Decreased weight shift to left (IR of L LE) Gait velocity: decreased Gait velocity interpretation: Below normal speed for age/gender General Gait Details: pt demonstrated safe technique; required VC'ing for  sequencing with RW  Stairs            Wheelchair Mobility    Modified Rankin (Stroke Patients Only)       Balance Overall balance assessment: Needs assistance Sitting-balance support: Feet supported;No upper extremity supported Sitting balance-Leahy Scale: Good     Standing balance support: During functional activity;Bilateral upper extremity supported Standing balance-Leahy Scale: Poor                               Pertinent Vitals/Pain Pain Assessment: 0-10 Pain Score: 4  Pain Location: L hip Pain Descriptors / Indicators: Sore;Grimacing;Guarding Pain Intervention(s): Monitored during session;Repositioned    Home Living Family/patient expects to be discharged to:: Private residence Living Arrangements: Spouse/significant other Available Help at Discharge: Family;Available 24 hours/day Type of Home: House Home Access: Level entry     Home Layout: One level Home Equipment: Bedside commode;Walker - 2 wheels;Cane - single point;Other (comment) (grabber)      Prior Function Level of Independence: Independent               Hand Dominance   Dominant Hand: Left    Extremity/Trunk Assessment   Upper Extremity Assessment: Defer to OT evaluation           Lower Extremity Assessment: LLE deficits/detail   LLE Deficits / Details: Pt with decreased strength and ROM limitations secondary to post-op.  Cervical / Trunk Assessment: Normal  Communication   Communication: No difficulties  Cognition Arousal/Alertness: Awake/alert Behavior During Therapy: WFL for tasks assessed/performed Overall Cognitive Status: Within Functional Limits for tasks assessed  General Comments      Exercises Total Joint Exercises Quad Sets: AROM;Strengthening;Left;10 reps;Seated Long Arc Quad: AROM;Strengthening;Left;10 reps;Seated Marching in Standing: AROM;Strengthening;Left;10 reps;Seated   Assessment/Plan    PT Assessment  Patient needs continued PT services  PT Problem List Decreased strength;Decreased range of motion;Decreased activity tolerance;Decreased balance;Decreased mobility;Decreased coordination;Decreased knowledge of use of DME;Pain          PT Treatment Interventions DME instruction;Gait training;Stair training;Functional mobility training;Therapeutic activities;Therapeutic exercise;Balance training;Neuromuscular re-education;Patient/family education    PT Goals (Current goals can be found in the Care Plan section)  Acute Rehab PT Goals Patient Stated Goal: return home PT Goal Formulation: With patient Time For Goal Achievement: 01/13/16 Potential to Achieve Goals: Good    Frequency 7X/week   Barriers to discharge        Co-evaluation               End of Session Equipment Utilized During Treatment: Gait belt Activity Tolerance: Patient limited by pain;Patient limited by fatigue Patient left: in chair;with call bell/phone within reach Nurse Communication: Mobility status         Time: [id number] PT Time Calculation (min) (ACUTE ONLY): 24 min   Charges:   PT Evaluation $PT Eval Low Complexity: 1 Procedure PT Treatments $Gait Training: 8-22 mins   PT G CodesAlessandra Bevels:        Jacilyn Sanpedro M Ewald Beg 01/06/2016, 12:34 PM Deborah ChalkJennifer Arminta Gamm, PT, DPT [phone]

## 2016-01-06 NOTE — Evaluation (Signed)
Occupational Therapy Evaluation Patient Details Name: Trevor Watson MRN: [id number] DOB: 05/02/1950 Today's Date: 01/06/2016    History of Present Illness Pt is a 65 y/o male s/p L THA (anterior approach). PMH including but not limited to HTN.   Clinical Impression   Pt is pleasant and willing to work with therapy. PTA pt independent in ADL and IADL, and looks forward to getting independence back post-op after therapy. Pt demonstrating good sink level ADL and wife is willing and able to help with LB ADL until ROM comes back . Pt has deficits listed below and would benefit from skilled OT in the acute care setting to maximize independence in ADL and safety for d/c home. Next session to focus on shower transfer and education on how to use 3 in 1 as shower chair.    Follow Up Recommendations  No OT follow up;Supervision - Intermittent    Equipment Recommendations  None recommended by OT    Recommendations for Other Services       Precautions / Restrictions Precautions Precautions: Fall Restrictions Weight Bearing Restrictions: Yes LLE Weight Bearing: Weight bearing as tolerated      Mobility Bed Mobility               General bed mobility comments: Pt on the way to the bathroom with nurse tech when OT arrived in room  Transfers Overall transfer level: Needs assistance Equipment used: Rolling walker (2 wheeled) Transfers: Sit to/from Stand Sit to Stand: Min guard         General transfer comment: pt required increased time and VC'ing for bilateral hand placement    Balance Overall balance assessment: Needs assistance Sitting-balance support: No upper extremity supported;Feet supported Sitting balance-Leahy Scale: Good     Standing balance support: During functional activity;Single extremity supported Standing balance-Leahy Scale: Poor                              ADL Overall ADL's : Needs assistance/impaired Eating/Feeding: Set  up;Sitting   Grooming: Wash/dry hands;Wash/dry face;Oral care;Min guard Grooming Details (indicate cue type and reason): sink level with RW         Upper Body Dressing : Supervision/safety;Sitting   Lower Body Dressing: Minimal assistance;Sit to/from stand Lower Body Dressing Details (indicate cue type and reason): Pt had pants on when OT arrived, and he said that he and his wife donned them bed level. Pt able to doff/don socks with min assist from therapist. Toilet Transfer: Min guard;Grab bars;RW   Toileting- Clothing Manipulation and Hygiene: Min guard;Sit to/from stand Toileting - Clothing Manipulation Details (indicate cue type and reason): Pt able to pull own pants down using one hand on grab bars and one to manipulate pants     Functional mobility during ADLs: Min guard;Rolling walker General ADL Comments: Pt anxious on post-op day 1. Provided verbal encouragement to Pt in post-op abilities     Vision Vision Assessment?: No apparent visual deficits   Perception     Praxis      Pertinent Vitals/Pain Pain Assessment: 0-10 Pain Score: 4  Faces Pain Scale: Hurts a little bit Pain Location: L hip Pain Descriptors / Indicators: Sore;Operative site guarding Pain Intervention(s): Monitored during session;Patient requesting pain meds-RN notified;Repositioned;Ice applied     Hand Dominance Left   Extremity/Trunk Assessment Upper Extremity Assessment Upper Extremity Assessment: Overall WFL for tasks assessed   Lower Extremity Assessment Lower Extremity Assessment: LLE deficits/detail LLE Deficits /  Details: Pt with decreased strength and ROM limitations secondary to post-op.   Cervical / Trunk Assessment Cervical / Trunk Assessment: Normal   Communication Communication Communication: No difficulties   Cognition Arousal/Alertness: Awake/alert Behavior During Therapy: WFL for tasks assessed/performed Overall Cognitive Status: Within Functional Limits for tasks  assessed                     General Comments       Exercises Exercises: Total Joint     Shoulder Instructions      Home Living Family/patient expects to be discharged to:: Private residence Living Arrangements: Spouse/significant other Available Help at Discharge: Family;Available 24 hours/day Type of Home: House Home Access: Level entry     Home Layout: One level     Bathroom Shower/Tub: Tub/shower unit Shower/tub characteristics: Engineer, building services: Standard Bathroom Accessibility: Yes How Accessible: Accessible via walker Home Equipment: Bedside commode;Walker - 2 wheels;Cane - single point;Other (comment) (grabber)          Prior Functioning/Environment Level of Independence: Independent                 OT Problem List: Decreased strength;Decreased range of motion;Decreased activity tolerance;Impaired balance (sitting and/or standing);Decreased knowledge of use of DME or AE;Pain   OT Treatment/Interventions: Self-care/ADL training;DME and/or AE instruction;Patient/family education    OT Goals(Current goals can be found in the care plan section) Acute Rehab OT Goals Patient Stated Goal: return home OT Goal Formulation: With patient/family Time For Goal Achievement: 01/13/16 Potential to Achieve Goals: Good ADL Goals Pt Will Perform Grooming: with supervision;standing Pt Will Transfer to Toilet: with supervision;ambulating;regular height toilet Pt Will Perform Toileting - Clothing Manipulation and hygiene: with supervision;sit to/from stand Pt Will Perform Tub/Shower Transfer: Tub transfer;with supervision;ambulating;rolling walker;3 in 1  OT Frequency: Min 2X/week   Barriers to D/C:            Co-evaluation              End of Session Equipment Utilized During Treatment: Rolling walker;Gait belt Nurse Communication: Patient requests pain meds  Activity Tolerance: Patient tolerated treatment well Patient left: in chair;with  call bell/phone within reach;with family/visitor present   Time: 0832-0915 OT Time Calculation (min): 43 min Charges:  OT General Charges $OT Visit: 1 Procedure OT Evaluation $OT Eval Low Complexity: 1 Procedure OT Treatments $Self Care/Home Management : 23-37 mins G-Codes:    Evern Bio Cottrell Gentles 2016-01-17, 4:18 PM Sherryl Manges OTR/L [phone]

## 2016-01-06 NOTE — Progress Notes (Signed)
Physical Therapy Treatment Patient Details Name: Trevor Watson MRN: [id number] DOB: May 13, 1950 Today's Date: 01/06/2016    History of Present Illness Pt is a 65 y/o male s/p L THA (anterior approach). PMH including but not limited to HTN.    PT Comments    Pt presented sitting OOB in recliner when PT entered room. Pt making good progress towards achieving his functional goals. Pt would continue to benefit from skilled physical therapy services at this time while admitted and after d/c to address his limitations in order to improve his overall safety and independence with functional mobility.   Follow Up Recommendations  Home health PT;Supervision for mobility/OOB     Equipment Recommendations  None recommended by PT    Recommendations for Other Services       Precautions / Restrictions Precautions Precautions: Fall Restrictions Weight Bearing Restrictions: Yes LLE Weight Bearing: Weight bearing as tolerated    Mobility  Bed Mobility               General bed mobility comments: pt sitting OOB in recliner when PT entered room  Transfers Overall transfer level: Needs assistance Equipment used: Rolling walker (2 wheeled) Transfers: Sit to/from Stand Sit to Stand: Min guard         General transfer comment: pt required increased time and VC'ing for bilateral hand placement  Ambulation/Gait Ambulation/Gait assistance: Min guard Ambulation Distance (Feet): 175 Feet Assistive device: Rolling walker (2 wheeled) Gait Pattern/deviations: Step-to pattern;Decreased step length - right;Decreased stance time - left;Decreased weight shift to left Gait velocity: decreased Gait velocity interpretation: Below normal speed for age/gender General Gait Details: pt required VC'ing for foreward gaze   Stairs            Wheelchair Mobility    Modified Rankin (Stroke Patients Only)       Balance Overall balance assessment: Needs assistance Sitting-balance  support: Feet supported;No upper extremity supported Sitting balance-Leahy Scale: Good     Standing balance support: During functional activity;Bilateral upper extremity supported Standing balance-Leahy Scale: Poor                      Cognition Arousal/Alertness: Awake/alert Behavior During Therapy: WFL for tasks assessed/performed Overall Cognitive Status: Within Functional Limits for tasks assessed                      Exercises Total Joint Exercises Ankle Circles/Pumps: AROM;Both;10 reps;Seated Quad Sets: AROM;Strengthening;Left;10 reps;Seated Heel Slides: AROM;Strengthening;Left;10 reps;Seated Hip ABduction/ADduction: AROM;Strengthening;Left;10 reps;Seated Long Arc Quad: AROM;Strengthening;Left;10 reps;Seated Marching in Standing: AROM;Strengthening;Left;10 reps;Seated    General Comments        Pertinent Vitals/Pain Pain Assessment: Faces Pain Score: 4  Faces Pain Scale: Hurts a little bit Pain Location: L hip Pain Descriptors / Indicators: Guarding;Sore Pain Intervention(s): Monitored during session;Repositioned;Ice applied    Home Living Family/patient expects to be discharged to:: Private residence Living Arrangements: Spouse/significant other Available Help at Discharge: Family;Available 24 hours/day Type of Home: House Home Access: Level entry   Home Layout: One level Home Equipment: Bedside commode;Walker - 2 wheels;Cane - single point;Other (comment) (grabber)      Prior Function Level of Independence: Independent          PT Goals (current goals can now be found in the care plan section) Acute Rehab PT Goals Patient Stated Goal: return home PT Goal Formulation: With patient Time For Goal Achievement: 01/13/16 Potential to Achieve Goals: Good Progress towards PT goals: Progressing toward goals  Frequency    7X/week      PT Plan Current plan remains appropriate    Co-evaluation             End of Session  Equipment Utilized During Treatment: Gait belt Activity Tolerance: Patient limited by pain;Patient limited by fatigue Patient left: in chair;with call bell/phone within reach     Time: 1425-1445 PT Time Calculation (min) (ACUTE ONLY): 20 min  Charges:  $Gait Training: 8-22 mins                    G CodesAlessandra Bevels:      Kahlen Boyde M Kyel Purk 01/06/2016, 2:59 PM Deborah ChalkJennifer Marlyne Totaro, PT, DPT [phone]

## 2016-01-06 NOTE — Care Management Note (Signed)
Case Management Note  Patient Details  Name: Trevor Watson MRN: [id number] Date of Birth: [birth year]  Subjective/Objective:     65 yr old gentleman s/p left total hip arthroplasty.               Action/Plan: Case manager spoke patient concerning Home Health. He has [employer], CM [facility] CM faxed all information and orders to them @ [phone]. Patient states that he will be borrowing RW and 3in1 from a friend. Will have family support at discharge.   Expected Discharge Date:   01/07/16               Expected Discharge Plan:  Home w Home Health Services  In-House Referral:     Discharge planning Services  CM Consult  Post Acute Care Choice:  Home Health Choice offered to:  NA ([facility] with [employer] will arrange Home Health)  DME Arranged:  N/A (patient borrowing RW and 3in1) DME Agency:  NA  HH Arranged:  PT HH Agency:   (being arranged by [facility])  Status of Service:  In process, will continue to follow  If discussed at Long Length of Stay Meetings, dates discussed:    Additional Comments:  Trevor Watson, Trevor Slider Naomi, RN 01/06/2016, 3:09 PM

## 2016-01-06 NOTE — Progress Notes (Signed)
Pt vomited after medications administered.  Stated he felt a little better but no appetite.  Ice chips and coke to patient.

## 2016-01-07 LAB — CBC
HEMATOCRIT: 36.3 % — AB (ref 39.0–52.0)
Hemoglobin: 11.8 g/dL — ABNORMAL LOW (ref 13.0–17.0)
MCH: 30.4 pg (ref 26.0–34.0)
MCHC: 32.5 g/dL (ref 30.0–36.0)
MCV: 93.6 fL (ref 78.0–100.0)
Platelets: 188 10*3/uL (ref 150–400)
RBC: 3.88 MIL/uL — ABNORMAL LOW (ref 4.22–5.81)
RDW: 12.1 % (ref 11.5–15.5)
WBC: 16.2 10*3/uL — ABNORMAL HIGH (ref 4.0–10.5)

## 2016-01-07 NOTE — Care Management (Signed)
Case manager received call from daisy with [facility] stating patient has been setup with [facility], patient was also notified of agency selected by [facility].

## 2016-01-07 NOTE — Progress Notes (Signed)
Occupational Therapy Treatment/Discharge Patient Details Name: Trevor Watson MRN: [id number] DOB: 28-May-1950 Today's Date: 01/07/2016    History of present illness Pt is a 65 y/o male s/p L THA (anterior approach). PMH including but not limited to HTN.   OT comments  PTA pt was independent with all ADL/IADLs. Pt admitted and underwent the above. Currently, pt requires assistance with dressing, bathing, and functional mobility. Pt requires supervision for functional ambulation and tub transfers. All education completed and pt and wife verbalize and demonstrate understanding without further questions concerning basic ADLs. Pt plans to D/C home with intermittent supervision from his wife. No OT follow-up recommended at this time. OT signing off.    Follow Up Recommendations  No OT follow up    Equipment Recommendations  None recommended by OT       Precautions / Restrictions Precautions Precautions: Fall Restrictions Weight Bearing Restrictions: Yes LLE Weight Bearing: Weight bearing as tolerated       Mobility Bed Mobility               General bed mobility comments: Pt dressed and up in chair upon OT arrival.  Transfers Overall transfer level: Needs assistance Equipment used: Rolling walker (2 wheeled) Transfers: Sit to/from Stand Sit to Stand: Supervision         General transfer comment: Pt required VC's for sequencing of tub transfer this date.    Balance Overall balance assessment: Needs assistance Sitting-balance support: Feet supported;No upper extremity supported Sitting balance-Leahy Scale: Good     Standing balance support: During functional activity Standing balance-Leahy Scale: Fair                     ADL Overall ADL's : Needs assistance/impaired                                 Tub/ Shower Transfer: Tub transfer;Supervision/safety;Ambulation;3 in 1;Rolling walker   Functional mobility during ADLs:  Supervision/safety;Rolling walker General ADL Comments: Pt dressed upon OT arrival and reports assistance from wife this AM. Pt with increased confidence post-op day 2. Required supervision for safety and VC's for sequencing of tub transfer with 3-in-1.                Cognition   Behavior During Therapy: WFL for tasks assessed/performed Overall Cognitive Status: Within Functional Limits for tasks assessed                                    Pertinent Vitals/ Pain       Pain Assessment: Faces Faces Pain Scale: Hurts a little bit Pain Location: L hip Pain Descriptors / Indicators: Operative site guarding Pain Intervention(s): Monitored during session;Repositioned            Progress Toward Goals  OT Goals(current goals can now be found in the care plan section)  Progress towards OT goals: Tub goal met and education completed, patient discharged from OT  Acute Rehab OT Goals Patient Stated Goal: return home OT Goal Formulation: With patient/family Time For Goal Achievement: 01/13/16 Potential to Achieve Goals: Good  Plan         End of Session Equipment Utilized During Treatment: Rolling walker   Activity Tolerance Patient tolerated treatment well   Patient Left in chair;with call bell/phone within reach;with family/visitor present   Nurse Communication Other (comment) (All  OT education complete with no f/u required.)        Time: 1001-1015 OT Time Calculation (min): 14 min  Charges: OT General Charges $OT Visit: 1 Procedure OT Treatments $Self Care/Home Management : 8-22 mins  Almon Register [phone] 01/07/2016, 10:27 AM

## 2016-01-07 NOTE — Progress Notes (Signed)
Physical Therapy Treatment Patient Details Name: Trevor Watson MRN: [id number] DOB: 1950/09/09 Today's Date: 01/07/2016    History of Present Illness Pt is a 65 y/o male s/p L THA (anterior approach). PMH including but not limited to HTN.    PT Comments    Pt presented supine in bed with HOB elevated, awake and willing to participate in therapy session. Pt's wife was present throughout session as well. Pt making great progress and successfully completed stair training during this session. Pt would continue to benefit from skilled physical therapy services at this time while admitted and after d/c to address his limitations in order to improve his overall safety and independence with functional mobility.   Follow Up Recommendations  Home health PT;Supervision for mobility/OOB     Equipment Recommendations  None recommended by PT    Recommendations for Other Services       Precautions / Restrictions Precautions Precautions: Fall Restrictions Weight Bearing Restrictions: Yes LLE Weight Bearing: Weight bearing as tolerated    Mobility  Bed Mobility Overal bed mobility: Needs Assistance Bed Mobility: Supine to Sit     Supine to sit: Modified independent (Device/Increase time);HOB elevated     General bed mobility comments: pt required increased time and use of bed rails  Transfers Overall transfer level: Needs assistance Equipment used: Rolling walker (2 wheeled) Transfers: Sit to/from Stand Sit to Stand: Supervision         General transfer comment: pt required increased time  Ambulation/Gait Ambulation/Gait assistance: Min guard Ambulation Distance (Feet): 200 Feet Assistive device: Rolling walker (2 wheeled) Gait Pattern/deviations: Step-through pattern;Decreased step length - right;Decreased stance time - left;Decreased weight shift to left Gait velocity: decreased Gait velocity interpretation: Below normal speed for age/gender General Gait Details: pt  required VC'ing for foreward gaze   Stairs Stairs: Yes Stairs assistance: Min guard Stair Management: One rail Right;Step to pattern;Forwards Number of Stairs: 4 General stair comments: pt ascended with R LE leading and descended with L LE leading  Wheelchair Mobility    Modified Rankin (Stroke Patients Only)       Balance Overall balance assessment: Needs assistance Sitting-balance support: Feet supported;No upper extremity supported Sitting balance-Leahy Scale: Good     Standing balance support: During functional activity;No upper extremity supported Standing balance-Leahy Scale: Fair                      Cognition Arousal/Alertness: Awake/alert Behavior During Therapy: WFL for tasks assessed/performed Overall Cognitive Status: Within Functional Limits for tasks assessed                      Exercises      General Comments        Pertinent Vitals/Pain Pain Assessment: Faces Faces Pain Scale: No hurt Pain Location: L hip Pain Descriptors / Indicators: Operative site guarding Pain Intervention(s): Monitored during session    Home Living                      Prior Function            PT Goals (current goals can now be found in the care plan section) Acute Rehab PT Goals Patient Stated Goal: return home PT Goal Formulation: With patient Time For Goal Achievement: 01/13/16 Potential to Achieve Goals: Good Progress towards PT goals: Progressing toward goals    Frequency    7X/week      PT Plan Current plan remains appropriate  Co-evaluation             End of Session Equipment Utilized During Treatment: Gait belt Activity Tolerance: Patient tolerated treatment well Patient left: Other (comment) (pt in bathroom with wife)     Time: [id number] PT Time Calculation (min) (ACUTE ONLY): 15 min  Charges:  $Gait Training: 8-22 mins                    G CodesAlessandra Bevels:      Anice Wilshire M Kahlie Deutscher 01/07/2016, 11:05 AM Deborah ChalkJennifer  Khaled Herda, PT, DPT [phone]

## 2016-01-07 NOTE — Discharge Summary (Signed)
Patient ID: Trevor Watson MRN: [id number] DOB/AGE: May 29, 1950 65 y.o.  Admit date: 01/05/2016 Discharge date: 01/07/2016  Admission Diagnoses:  Principal Problem:   Primary osteoarthritis of left hip Active Problems:   Primary localized osteoarthritis of left hip   Discharge Diagnoses:  Same  Past Medical History:  Diagnosis Date  . Allergy   . Anxiety   . Arthritis   . Asthma   . Bronchitis    recurrent  . Complication of anesthesia   . GERD (gastroesophageal reflux disease)   . History of inguinal hernia repair   . Hypertension   . IBS (irritable bowel syndrome)   . PONV (postoperative nausea and vomiting)    AFTER HIP SURGERY     12/2015  . Recurrent boils   . Thyroid nodule    checked every year has not changed    Surgeries: Procedure(s): TOTAL HIP ARTHROPLASTY ANTERIOR APPROACH on 01/05/2016   Consultants:   Discharged Condition: Improved  Hospital Course: SAMEUL Watson is an 65 y.o. male who was admitted 01/05/2016 for operative treatment ofPrimary osteoarthritis of left hip. Patient has severe unremitting pain that affects sleep, daily activities, and work/hobbies. After pre-op clearance the patient was taken to the operating room on 01/05/2016 and underwent  Procedure(s): TOTAL HIP ARTHROPLASTY ANTERIOR APPROACH.    Patient was given perioperative antibiotics: Anti-infectives    Start     Dose/Rate Route Frequency Ordered Stop   01/05/16 1230  ceFAZolin (ANCEF) IVPB 2g/100 mL premix     2 g 200 mL/hr over 30 Minutes Intravenous To ShortStay Surgical 01/04/16 1034 01/05/16 1345       Patient was given sequential compression devices, early ambulation, and chemoprophylaxis to prevent DVT.  Patient benefited maximally from hospital stay and there were no complications.    Recent vital signs: Patient Vitals for the past 24 hrs:  BP Temp Temp src Pulse Resp SpO2  01/07/16 0507 130/62 97.9 F (36.6 C) Oral 64 16 100 %  01/06/16 2148 120/68 99.3  F (37.4 C) Oral 70 16 97 %  01/06/16 1300 137/74 98.4 F (36.9 C) Oral - - 94 %     Recent laboratory studies:  Recent Labs  01/06/16 0609 01/07/16 0544  WBC 11.0* 16.2*  HGB 12.4* 11.8*  HCT 38.1* 36.3*  PLT 195 188  NA 135  --   K 4.8  --   CL 99*  --   CO2 27  --   BUN 18  --   CREATININE 1.36*  --   GLUCOSE 132*  --   CALCIUM 9.1  --      Discharge Medications:     Medication List    STOP taking these medications   acetaminophen 650 MG CR tablet Commonly known as:  TYLENOL   aspirin 81 MG tablet Replaced by:  aspirin EC 325 MG tablet   meloxicam 15 MG tablet Commonly known as:  MOBIC     TAKE these medications   albuterol 108 (90 Base) MCG/ACT inhaler Commonly known as:  PROVENTIL HFA;VENTOLIN HFA Inhale 2 puffs into the lungs every 6 (six) hours as needed for wheezing or shortness of breath.   aspirin EC 325 MG tablet Take 1 tablet (325 mg total) by mouth 2 (two) times daily. Replaces:  aspirin 81 MG tablet   COSAMIN ASU ADVANCED FORMULA Caps Take 2 tablets by mouth 2 (two) times daily.   losartan-hydrochlorothiazide 100-25 MG tablet Commonly known as:  HYZAAR Take 1 tablet by mouth daily.  methocarbamol 500 MG tablet Commonly known as:  ROBAXIN Take 1 tablet (500 mg total) by mouth 2 (two) times daily with a meal.   omeprazole 20 MG tablet Commonly known as:  PRILOSEC OTC Take 1 tablet (20 mg total) by mouth daily.   OVER THE COUNTER MEDICATION Take 1 tablet by mouth 2 (two) times daily. Joint multivitamin   oxyCODONE-acetaminophen 5-325 MG tablet Commonly known as:  ROXICET Take 1 tablet by mouth every 4 (four) hours as needed.   sildenafil 20 MG tablet Commonly known as:  REVATIO One to four tabs by mouth daily as needed for sex.       Diagnostic Studies: Dg Chest 2 View  Result Date: 12/28/2015 CLINICAL DATA:  Preop for total hip arthroplasty. EXAM: CHEST  2 VIEW COMPARISON:  03/30/2011 FINDINGS: The heart size and  mediastinal contours are within normal limits. Both lungs are clear. The visualized skeletal structures are unremarkable. IMPRESSION: Normal chest x-ray Electronically Signed   By: Rudie Meyer M.D.   On: 12/28/2015 08:25   Dg C-arm 1-60 Min  Result Date: 01/05/2016 CLINICAL DATA:  Status post LEFT hip total arthroplasty. EXAM: OPERATIVE LEFT HIP (WITH PELVIS IF PERFORMED) 2 VIEWS TECHNIQUE: Fluoroscopic spot image(s) were submitted for interpretation post-operatively. Interpreting radiologist was not present at time of surgery. FLUOROSCOPY TIME:  21 seconds. COMPARISON:  LEFT hip radiograph June 12, 2014 FINDINGS: Status post LEFT hip total arthroplasty, well-seated hardware. IMPRESSION: Intraoperative fluoroscopic spot views of LEFT hip total arthroplasty. Electronically Signed   By: Awilda Metro M.D.   On: 01/05/2016 15:47   Dg Hip Operative Unilat W Or W/o Pelvis Left  Result Date: 01/05/2016 CLINICAL DATA:  Status post LEFT hip total arthroplasty. EXAM: OPERATIVE LEFT HIP (WITH PELVIS IF PERFORMED) 2 VIEWS TECHNIQUE: Fluoroscopic spot image(s) were submitted for interpretation post-operatively. Interpreting radiologist was not present at time of surgery. FLUOROSCOPY TIME:  21 seconds. COMPARISON:  LEFT hip radiograph June 12, 2014 FINDINGS: Status post LEFT hip total arthroplasty, well-seated hardware. IMPRESSION: Intraoperative fluoroscopic spot views of LEFT hip total arthroplasty. Electronically Signed   By: Awilda Metro M.D.   On: 01/05/2016 15:47    Disposition: 01-Home or Self Care  Discharge Instructions    Call MD / Call 911    Complete by:  As directed    If you experience chest pain or shortness of breath, CALL 911 and be transported to the hospital emergency room.  If you develope a fever above 101 F, pus (white drainage) or increased drainage or redness at the wound, or calf pain, call your surgeon's office.   Constipation Prevention    Complete by:  As directed     Drink plenty of fluids.  Prune juice may be helpful.  You may use a stool softener, such as Colace (over the counter) 100 mg twice a day.  Use MiraLax (over the counter) for constipation as needed.   Diet - low sodium heart healthy    Complete by:  As directed    Driving restrictions    Complete by:  As directed    No driving for 2 weeks   Follow the hip precautions as taught in Physical Therapy    Complete by:  As directed    Increase activity slowly as tolerated    Complete by:  As directed    Patient may shower    Complete by:  As directed    You may shower without a dressing once there is no drainage.  Do not wash over the wound.  If drainage remains, cover wound with plastic wrap and then shower.      Follow-up Information    Nestor LewandowskyOWAN,FRANK J, MD Follow up in 2 week(s).   Specialty:  Orthopedic Surgery Contact information: [address] [place] [id number] [phone]            Signed: Vear ClockHILLIPS, Darrion Wyszynski R 01/07/2016, 8:21 AM

## 2016-01-07 NOTE — Progress Notes (Signed)
Discharge instructions RX's and follow up appts explained and provided to patient and family verbalized understanding. Patient left floor via wheelchair accompanied by volunteers. Home health set up for patient  No c/o pain or shortness of breath at discharge.  Alexi Dorminey, Kae HellerMiranda Lynn, RN

## 2016-01-07 NOTE — Progress Notes (Signed)
PATIENT ID: Trevor EarthlyJeffrey K Timberlake  MRN: [id number]  DOB/AGE:  63-May-1952 / 63 y.o.  2 Days Post-Op Procedure(s) (LRB): TOTAL HIP ARTHROPLASTY ANTERIOR APPROACH (Left)    PROGRESS NOTE Subjective: Patient is alert, oriented, no Nausea, no Vomiting, yes passing gas, . Taking PO well with pt eating 1/2 of breakfast. Denies SOB, Chest or Calf Pain. Using Incentive Spirometer, PAS in place. Ambulate WBAT with pt walking 150 ft with therapy Patient reports pain as  4/10  .    Objective: Vital signs in last 24 hours: Vitals:   01/06/16 0400 01/06/16 1300 01/06/16 2148 01/07/16 0507  BP: 128/65 137/74 120/68 130/62  Pulse: 63  70 64  Resp: 16  16 16   Temp: 98.4 F (36.9 C) 98.4 F (36.9 C) 99.3 F (37.4 C) 97.9 F (36.6 C)  TempSrc: Oral Oral Oral Oral  SpO2: 99% 94% 97% 100%      Intake/Output from previous day: I/O last 3 completed shifts: In: 600 [P.O.:600] Out: 825 [Urine:825]   Intake/Output this shift: No intake/output data recorded.   LABORATORY DATA:  Recent Labs  01/06/16 0609 01/07/16 0544  WBC 11.0* 16.2*  HGB 12.4* 11.8*  HCT 38.1* 36.3*  PLT 195 188  NA 135  --   K 4.8  --   CL 99*  --   CO2 27  --   BUN 18  --   CREATININE 1.36*  --   GLUCOSE 132*  --   CALCIUM 9.1  --     Examination: Neurologically intact Neurovascular intact Sensation intact distally Intact pulses distally Dorsiflexion/Plantar flexion intact Incision: dressing C/D/I No cellulitis present Compartment soft} XR AP&Lat of hip shows well placed\fixed THA  Assessment:   2 Days Post-Op Procedure(s) (LRB): TOTAL HIP ARTHROPLASTY ANTERIOR APPROACH (Left) ADDITIONAL DIAGNOSIS:  Expected Acute Blood Loss Anemia, Hypertension  Plan: PT/OT WBAT, THA  DVT Prophylaxis: SCDx72 hrs, ASA 325 mg BID x 2 weeks  DISCHARGE PLAN: Home, today  DISCHARGE NEEDS: HHPT, Walker and 3-in-1 comode seat

## 2016-01-21 ENCOUNTER — Encounter: Payer: Self-pay | Admitting: Osteopathic Medicine

## 2016-01-24 ENCOUNTER — Other Ambulatory Visit: Payer: Self-pay

## 2016-01-24 DIAGNOSIS — I10 Essential (primary) hypertension: Secondary | ICD-10-CM

## 2016-01-24 MED ORDER — LOSARTAN POTASSIUM-HCTZ 100-25 MG PO TABS: 1.0000 | ORAL_TABLET | Freq: Every day | ORAL | 0 refills | Status: DC

## 2016-02-10 ENCOUNTER — Ambulatory Visit (INDEPENDENT_AMBULATORY_CARE_PROVIDER_SITE_OTHER): Payer: Managed Care, Other (non HMO) | Admitting: Osteopathic Medicine

## 2016-02-10 ENCOUNTER — Encounter: Payer: Self-pay | Admitting: Osteopathic Medicine

## 2016-02-10 VITALS — BP 127/61 | HR 73 | Ht 71.0 in | Wt 194.0 lb

## 2016-02-10 DIAGNOSIS — N529 Male erectile dysfunction, unspecified: Secondary | ICD-10-CM | POA: Diagnosis not present

## 2016-02-10 DIAGNOSIS — I10 Essential (primary) hypertension: Secondary | ICD-10-CM | POA: Diagnosis not present

## 2016-02-10 DIAGNOSIS — E041 Nontoxic single thyroid nodule: Secondary | ICD-10-CM | POA: Diagnosis not present

## 2016-02-10 DIAGNOSIS — Z23 Encounter for immunization: Secondary | ICD-10-CM

## 2016-02-10 DIAGNOSIS — Z0000 Encounter for general adult medical examination without abnormal findings: Secondary | ICD-10-CM

## 2016-02-10 MED ORDER — SILDENAFIL CITRATE 20 MG PO TABS: ORAL_TABLET | ORAL | 3 refills | Status: DC

## 2016-02-10 MED ORDER — ZOSTER VACCINE LIVE 19400 UNT/0.65ML ~~LOC~~ SUSR: 0.6500 mL | Freq: Once | SUBCUTANEOUS | 0 refills | Status: AC

## 2016-02-10 NOTE — Progress Notes (Signed)
HPI: Trevor Watson is a 65 y.o. male  who presents to [facility] [place] today, 02/10/16,  for chief complaint of:  Chief Complaint  Patient presents with  . Establish Care    [facility]    Patient here for annual physical exam. See below for review of preventive care.  Has some questions about PSA testing, thinks he may be due for this.  Requests refill on rectal dysfunction medication  Notes he thinks he is due for follow-up ultrasound for thyroid nodule  Past medical, surgical, social and family history reviewed: Past Medical History:  Diagnosis Date  . Allergy   . Anxiety   . Arthritis   . Asthma   . Bronchitis    recurrent  . Complication of anesthesia   . GERD (gastroesophageal reflux disease)   . History of inguinal hernia repair   . Hypertension   . IBS (irritable bowel syndrome)   . PONV (postoperative nausea and vomiting)    AFTER HIP SURGERY     12/2015  . Recurrent boils   . Thyroid nodule    checked every year has not changed   Past Surgical History:  Procedure Laterality Date  . LAPAROSCOPIC CHOLECYSTECTOMY  1996  . left inguinal hernia repair  age 89  . right inguinl hernia repair w/ mesh  09/07   Dr. Janee Mornhompson  . 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 Smoking status: Former Smoker    Packs/day: 2.00    Years: 30.00    Types: Cigarettes    Quit date: 04/18/1983  . Smokeless tobacco: Never Used  . Alcohol use 3.6 oz/week    6 Cans of beer per week     Comment: about 6 drinks per week   Family History  Problem Relation Age of Onset  . Breast cancer Mother   . Thyroid cancer Father   . Hypertension Father   . Parkinson's disease Father   . Colon cancer Neg Hx      Current medication list and allergy/intolerance  information reviewed:   Current Outpatient Prescriptions  Medication Sig Dispense Refill  . albuterol (PROVENTIL HFA;VENTOLIN HFA) 108 (90 BASE) MCG/ACT inhaler Inhale 2 puffs into the lungs every 6 (six) hours as needed for wheezing or shortness of breath. 1 Inhaler 2  . losartan-hydrochlorothiazide (HYZAAR) 100-25 MG tablet Take 1 tablet by mouth daily. 90 tablet 0  . omeprazole (PRILOSEC OTC) 20 MG tablet Take 1 tablet (20 mg total) by mouth daily. 90 tablet 3  . sildenafil (REVATIO) 20 MG tablet One to four tabs by mouth daily as needed for sex. 50 tablet 3   No current facility-administered medications for this visit.    Allergies  Allergen Reactions  . No Known Allergies       Review of Systems:  Constitutional:  No  fever, no chills, No recent illness, No unintentional weight changes. No significant fatigue.   HEENT: No  headache, no vision change, no hearing change, No sore throat, No  sinus pressure  Cardiac: No  chest pain, No  pressure, No palpitations, No  Orthopnea  Respiratory:  No  shortness of breath. No  Cough  Gastrointestinal: No  abdominal pain, No  nausea, No  vomiting,  No  blood in stool, No  diarrhea, No  constipation  Musculoskeletal: No new myalgia/arthralgia  Skin: No  Rash, No other wounds/concerning lesions  Neurologic: No  weakness, No  dizziness  Psychiatric: No  concerns with depression, No  concerns with anxiety, No sleep problems, No mood problems  Exam:  BP 127/61   Pulse 73   Ht 5\' 11"  (1.803 m)   Wt 194 lb (88 kg)   BMI 27.06 kg/m   Constitutional: VS see above. General Appearance: alert, well-developed, well-nourished, NAD  Eyes: Normal lids and conjunctive, non-icteric sclera  Ears, Nose, Mouth, Throat: MMM, Normal external inspection ears/nares/mouth/lips/gums. TM normal bilaterally. Pharynx/tonsils no erythema, no exudate. Nasal mucosa normal.   Neck: No masses, trachea midline. No thyroid enlargement. No tenderness/mass  appreciated. No lymphadenopathy  Respiratory: Normal respiratory effort. no wheeze, no rhonchi, no rales  Cardiovascular: S1/S2 normal, no murmur, no rub/gallop auscultated. RRR. No lower extremity edema.   Gastrointestinal: Nontender, no masses. No hepatomegaly, no splenomegaly. No hernia appreciated. Bowel sounds normal. Rectal exam deferred.   Musculoskeletal: Gait normal. No clubbing/cyanosis of digits.   Neurological: Normal balance/coordination. No tremor. No cranial nerve deficit on limited exam.   Skin: warm, dry, intact. No rash/ulcer. No concerning nevi or subq nodules on limited exam.    Psychiatric: Normal judgment/insight. Normal mood and affect. Oriented x3.     ASSESSMENT/PLAN:   Annual physical exam - Plan: CBC with Differential/Platelet, COMPLETE METABOLIC PANEL WITH GFR, TSH, Lipid panel, Hepatitis C antibody, PSA, Total with Reflex to PSA, Free, CANCELED: PSA, total and free  Essential hypertension  Erectile dysfunction, unspecified erectile dysfunction type - Plan: sildenafil (REVATIO) 20 MG tablet  Thyroid nodule - Plan: US Soft Tissue Head/Neck  Need for prophylactic vaccination and inoculation against influenza - Plan: Flu Vaccine QUAD 36+ mos IM  Need for diphtheria-tetanus-pertussis (Tdap) vaccine, adult/adolescent - Plan: Tdap vaccine greater than or equal to 7yo IM    MALE PREVENTIVE CARE updated 02/10/16  ANNUAL SCREENING/COUNSELING  Any changes to health in the past year? Hip replacement, no other major changes  Tobacco - no - former smoker  Alcohol - social drinker  Diet/Exercise - Healthy habits discussed to decrease CV risk and promote overall health. Patient does not have dietary restrictions.   Depression - PQH2 Negative  Feel safe at home? - yes  HTN SCREENING - SEE VITALS  SEXUAL/REPRODUCTIVE HEALTH  Sexually active? - Yes with male.  STI testing needed/desired today? - no  Any concerns with testosterone/libido? -  no  INFECTIOUS DISEASE SCREENING  HIV - does not need  GC/CT - does not need  HepC - does not need  TB - does not need  CANCER SCREENING  Lung - does not need  Colon - does not need - colonoscopy 2015 - f/u 10 years  Prostate - needs, requests PSA  AAA - former smoker   OTHER DISEASE SCREENING  Lipid - needs  DM2 - needs  Osteoporosis - does not need  ADULT VACCINATION  Influenza - needs today, annual vaccine recommended  Td - was given  Zoster - was not indicated  Pneumonia - already has        Patient Instructions  Preventive care reviewed today:  Routine screening for cholesterol and diabetes, particularly in patients with history of high blood pressure  Labs for PSA check and hepatitis C screening based on age  Immunizations for flu and tetanus, you've been given a prescription for the shingles vaccine to have this done at pharmacy  Plan for repeat colonoscopy in 2025, screening for  abdominal aortic aneurysm given smoking history at age 73  Follow-up care reviewed today:  Thyroid ultrasound ordered to follow-up on nodule  Routine medication refills  Plan to follow-up in 6 months for blood pressure check, sooner if needed  Thanks for coming in today! Take care, and please let us know if you have any questions or concerns. -Dr. Mervyn Skeeters.     Visit summary with medication list and pertinent instructions was printed for patient to review. All questions at time of visit were answered - patient instructed to contact office with any additional concerns. ER/RTC precautions were reviewed with the patient. Follow-up plan: Return in about 6 months (around 08/10/2016) for BLOOD PRESURE FOLLOW-UP.

## 2016-02-10 NOTE — Patient Instructions (Addendum)
Preventive care reviewed today:  Routine screening for cholesterol and diabetes, particularly in patients with history of high blood pressure  Labs for PSA check and hepatitis C screening based on age  Immunizations for flu and tetanus, you've been given a prescription for the shingles vaccine to have this done at pharmacy  Plan for repeat colonoscopy in 2025, screening for abdominal aortic aneurysm given smoking history at age 65  Follow-up care reviewed today:  Thyroid ultrasound ordered to follow-up on nodule  Routine medication refills  Plan to follow-up in 6 months for blood pressure check, sooner if needed  Thanks for coming in today! Take care, and please let us know if you have any questions or concerns. -Dr. Mervyn SkeetersA.         Thank you for coming in today. You should receive an email asking you to complete a brief survey regarding your experience today at [facility]. Please take a moment to respond to this survey. Our goal is to serve you! Constructive criticism helps us to improve, and positive feedback helps our practice to shine, plus it makes us smile! Thanks in advance for your feedback.

## 2016-02-11 LAB — CBC WITH DIFFERENTIAL/PLATELET
BASOS PCT: 1 %
Basophils Absolute: 46 cells/uL (ref 0–200)
EOS PCT: 4 %
Eosinophils Absolute: 184 cells/uL (ref 15–500)
HEMATOCRIT: 39.9 % (ref 38.5–50.0)
HEMOGLOBIN: 13.3 g/dL (ref 13.2–17.1)
LYMPHS ABS: 1242 {cells}/uL (ref 850–3900)
LYMPHS PCT: 27 %
MCH: 30.1 pg (ref 27.0–33.0)
MCHC: 33.3 g/dL (ref 32.0–36.0)
MCV: 90.3 fL (ref 80.0–100.0)
MONO ABS: 644 {cells}/uL (ref 200–950)
MPV: 9.6 fL (ref 7.5–12.5)
Monocytes Relative: 14 %
Neutro Abs: 2484 cells/uL (ref 1500–7800)
Neutrophils Relative %: 54 %
Platelets: 253 10*3/uL (ref 140–400)
RBC: 4.42 MIL/uL (ref 4.20–5.80)
RDW: 13.2 % (ref 11.0–15.0)
WBC: 4.6 10*3/uL (ref 3.8–10.8)

## 2016-02-11 LAB — COMPLETE METABOLIC PANEL WITH GFR
ALT: 20 U/L (ref 9–46)
AST: 19 U/L (ref 10–35)
Albumin: 4.4 g/dL (ref 3.6–5.1)
Alkaline Phosphatase: 88 U/L (ref 40–115)
BUN: 15 mg/dL (ref 7–25)
CALCIUM: 9.5 mg/dL (ref 8.6–10.3)
CHLORIDE: 100 mmol/L (ref 98–110)
CO2: 27 mmol/L (ref 20–31)
CREATININE: 1.29 mg/dL — AB (ref 0.70–1.25)
GFR, Est African American: 68 mL/min (ref >=60)
GFR, Est Non African American: 59 mL/min — ABNORMAL LOW (ref >=60)
Glucose, Bld: 83 mg/dL (ref 65–99)
Potassium: 3.8 mmol/L (ref 3.5–5.3)
SODIUM: 140 mmol/L (ref 135–146)
Total Bilirubin: 0.6 mg/dL (ref 0.2–1.2)
Total Protein: 6.9 g/dL (ref 6.1–8.1)

## 2016-02-11 LAB — LIPID PANEL
CHOL/HDL RATIO: 3.9 ratio (ref <=5.0)
Cholesterol: 162 mg/dL (ref 125–200)
HDL: 42 mg/dL (ref >=40)
LDL CALC: 93 mg/dL (ref <130)
Triglycerides: 134 mg/dL (ref <150)
VLDL: 27 mg/dL (ref <30)

## 2016-02-11 LAB — TSH: TSH: 1.73 m[IU]/L (ref 0.40–4.50)

## 2016-02-12 LAB — HEPATITIS C ANTIBODY: HCV AB: NEGATIVE

## 2016-02-14 ENCOUNTER — Ambulatory Visit (INDEPENDENT_AMBULATORY_CARE_PROVIDER_SITE_OTHER): Payer: Managed Care, Other (non HMO)

## 2016-02-14 DIAGNOSIS — E041 Nontoxic single thyroid nodule: Secondary | ICD-10-CM

## 2016-02-14 LAB — PSA, TOTAL WITH REFLEX TO PSA, FREE: PSA, TOTAL: 1.6 ng/mL (ref <=4.0)

## 2016-04-24 ENCOUNTER — Other Ambulatory Visit: Payer: Self-pay | Admitting: Osteopathic Medicine

## 2016-04-24 DIAGNOSIS — I10 Essential (primary) hypertension: Secondary | ICD-10-CM

## 2016-08-10 ENCOUNTER — Encounter: Payer: Self-pay | Admitting: Osteopathic Medicine

## 2016-08-10 ENCOUNTER — Ambulatory Visit (INDEPENDENT_AMBULATORY_CARE_PROVIDER_SITE_OTHER): Payer: Managed Care, Other (non HMO) | Admitting: Osteopathic Medicine

## 2016-08-10 VITALS — BP 137/71 | HR 69 | Ht 68.0 in | Wt 204.0 lb

## 2016-08-10 DIAGNOSIS — Z711 Person with feared health complaint in whom no diagnosis is made: Secondary | ICD-10-CM | POA: Diagnosis not present

## 2016-08-10 DIAGNOSIS — I10 Essential (primary) hypertension: Secondary | ICD-10-CM

## 2016-08-10 DIAGNOSIS — K219 Gastro-esophageal reflux disease without esophagitis: Secondary | ICD-10-CM

## 2016-08-10 DIAGNOSIS — M79673 Pain in unspecified foot: Secondary | ICD-10-CM | POA: Diagnosis not present

## 2016-08-10 DIAGNOSIS — Z23 Encounter for immunization: Secondary | ICD-10-CM | POA: Diagnosis not present

## 2016-08-10 MED ORDER — ZOSTER VAC RECOMB ADJUVANTED 50 MCG/0.5ML IM SUSR: 0.5000 mL | Freq: Once | INTRAMUSCULAR | 1 refills | Status: AC

## 2016-08-10 NOTE — Progress Notes (Signed)
HPI: Trevor Watson is a 66 y.o. male  who presents to [facility] [place] today, 08/10/16,  for chief complaint of:  Chief Complaint  Patient presents with  . Follow-up    BLOOD PRESSURE    HTN: controlled on current medications, no chest pain/pressure, no SOB/dizziness.   GERD: stable alternating every other week of PPI w/ H2B. Concerned about long-term PPI effects but this is the only thing that seems to help the GERD. No early satiety or N/V.   Skin: He and wife were previously prescribed antibiotic (Keflex apparently on record review) by previous PCP to take as needed for occasional boils on the skin. No culture was done per patient. Wants to know if he can get refill of this. No current rash or abnormal lesion.   Foot pain: occasionally bothers him on both feet worse on R at 1st MTP but across the top of the foot. No pain today, just wonders what this might be.  Would like shingles vaccine    Past medical history, surgical history, social history and family history reviewed.  Patient Active Problem List   Diagnosis Date Noted  . Primary localized osteoarthritis of left hip 01/05/2016  . Airway hyperreactivity 03/16/2014  . Thyroid nodule 12/03/2013  . Hyperglycemia 12/03/2013  . Recurrent boils 12/03/2013  . Tremor 12/03/2013  . Degenerative joint disease of hand 12/03/2013  . Hypertension 10/11/2011  . Snoring 03/30/2011  . Asthmatic bronchitis 03/30/2011  . INGUINAL HERNIA 03/14/2009  . GERD 03/01/2009  . IRRITABLE BOWEL SYNDROME 03/01/2009  . Adaptive colitis 03/01/2009    Current medication list and allergy/intolerance information reviewed.   Current Outpatient Prescriptions on File Prior to Visit  Medication Sig Dispense Refill  . albuterol (PROVENTIL HFA;VENTOLIN HFA) 108 (90 BASE) MCG/ACT inhaler Inhale 2 puffs into the lungs every 6 (six) hours as needed for wheezing or shortness of breath. 1 Inhaler 2  . aspirin EC 81 MG  tablet Take 81 mg by mouth daily.    Marland Kitchen losartan-hydrochlorothiazide (HYZAAR) 100-25 MG tablet TAKE ONE TABLET BY MOUTH DAILY 90 tablet 3  . omeprazole (PRILOSEC OTC) 20 MG tablet Take 1 tablet (20 mg total) by mouth daily. 90 tablet 3  . sildenafil (REVATIO) 20 MG tablet One to four tabs by mouth daily as needed for sex. 50 tablet 3   No current facility-administered medications on file prior to visit.    Allergies  Allergen Reactions  . No Known Allergies       Review of Systems:  Constitutional: No recent illness  HEENT: No  headache, no vision change  Cardiac: No  chest pain, No  pressure, No palpitations  Respiratory:  No  shortness of breath. No  Cough  Musculoskeletal: +new myalgia/arthralgia  Skin: No  Rash  Exam:  BP 137/71   Pulse 69   Ht  (1.727 m)   Wt 204 lb (92.5 kg)   BMI 31.02 kg/m   Constitutional: VS see above. General Appearance: alert, well-developed, well-nourished, NAD  Eyes: Normal lids and conjunctive, non-icteric sclera  Ears, Nose, Mouth, Throat: MMM, Normal external inspection ears/nares/mouth/lips/gums.  Neck: No masses, trachea midline.   Respiratory: Normal respiratory effort. no wheeze, no rhonchi, no rales  Cardiovascular: S1/S2 normal, no murmur, no rub/gallop auscultated. RRR.   Musculoskeletal: Gait normal. Symmetric and independent movement of all extremities  Neurological: Normal balance/coordination. No tremor.  Skin: warm, dry, intact.   Psychiatric: Normal judgment/insight. Normal mood and affect. Oriented x3.  Labs reviewed: recheck renal fxn and needs UA w/ annual physical, stable Cr, otherwise no concerns   ASSESSMENT/PLAN: The primary encounter diagnosis was Essential hypertension. Diagnoses of Gastroesophageal reflux disease without esophagitis, Pain of foot, unspecified laterality, Need for shingles vaccine, and Concern about skin disease without diagnosis were also pertinent to this visit.  OK for BP Rx  refills.  Advised RTC if skin concern arises so we can get a culture, if MRSA would consider diluted bleach baths or other antibiotic  GERD: advised trial H2B only, consider GI referral Foot pain: not bothering him today, advised sports medicine followup of recurs, consider orthotics. No hx injury .   Follow-up plan: Return in about 6 months (around 02/09/2017) for Annual physical, sooner if needed for foot with sports med.  Visit summary with medication list and pertinent instructions was printed for patient to review, alert [place] if any changes needed. All questions at time of visit were answered - patient instructed to contact office with any additional concerns. ER/RTC precautions were reviewed with the patient and understanding verbalized.   Note: Total time spent 15 minutes, greater than 50% of the visit was spent face-to-face counseling and coordinating care for the following: The primary encounter diagnosis was Essential hypertension. Diagnoses of Gastroesophageal reflux disease without esophagitis, Pain of foot, unspecified laterality, Need for shingles vaccine, and Concern about skin disease without diagnosis were also pertinent to this visit.Marland Kitchen

## 2017-02-03 ENCOUNTER — Other Ambulatory Visit: Payer: Self-pay | Admitting: Osteopathic Medicine

## 2017-02-03 DIAGNOSIS — N529 Male erectile dysfunction, unspecified: Secondary | ICD-10-CM

## 2017-02-08 ENCOUNTER — Ambulatory Visit (INDEPENDENT_AMBULATORY_CARE_PROVIDER_SITE_OTHER): Payer: Managed Care, Other (non HMO) | Admitting: Osteopathic Medicine

## 2017-02-08 ENCOUNTER — Encounter: Payer: Self-pay | Admitting: Osteopathic Medicine

## 2017-02-08 VITALS — BP 152/90 | HR 64 | Wt 203.0 lb

## 2017-02-08 DIAGNOSIS — Z0000 Encounter for general adult medical examination without abnormal findings: Secondary | ICD-10-CM | POA: Diagnosis not present

## 2017-02-08 DIAGNOSIS — I10 Essential (primary) hypertension: Secondary | ICD-10-CM

## 2017-02-08 DIAGNOSIS — Z23 Encounter for immunization: Secondary | ICD-10-CM

## 2017-02-08 NOTE — Patient Instructions (Addendum)
   Plan to return for nurse visit to verify home blood pressure cuff. In the meantime, be keeping a record of you rblood pressures at home and bring this with you to the visit with the nurse.   If your cuff is measuring within 5-10 points of ours AND your home numbers are less than less than 130/80, then nothing else to do.   If your home blood pressure cuff is inaccurate or is accurate but measuring above goal, we will need to talk about adjusting your medications.

## 2017-02-08 NOTE — Progress Notes (Signed)
HPI: Trevor Watson is a 66 y.o. male  who presents to [facility] [place] today, 02/08/17,  for chief complaint of:  Chief Complaint  Patient presents with  . Annual Exam      Patient here for annual physical / wellness exam.  See preventive care reviewed as below.  Recent labs reviewed in detail with the patient.   Additional concerns today include:  None  Past medical, surgical, social and family history reviewed: Patient Active Problem List   Diagnosis Date Noted  . Concern about skin disease without diagnosis 08/10/2016  . Pain of foot 08/10/2016  . Primary localized osteoarthritis of left hip 01/05/2016  . Airway hyperreactivity 03/16/2014  . Thyroid nodule 12/03/2013  . Hyperglycemia 12/03/2013  . Recurrent boils 12/03/2013  . Tremor 12/03/2013  . Degenerative joint disease of hand 12/03/2013  . Hypertension 10/11/2011  . Snoring 03/30/2011  . Asthmatic bronchitis 03/30/2011  . INGUINAL HERNIA 03/14/2009  . GERD 03/01/2009  . IRRITABLE BOWEL SYNDROME 03/01/2009  . Adaptive colitis 03/01/2009   Past Surgical History:  Procedure Laterality Date  . LAPAROSCOPIC CHOLECYSTECTOMY  1996  . left inguinal hernia repair  age 89  . right inguinl hernia repair w/ mesh  09/07   Dr. Janee Mornhompson  . 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 Smoking status: Former Smoker    Packs/day: 2.00    Years: 30.00    Types: Cigarettes    Quit date: 04/18/1983  . Smokeless tobacco: Never Used  . Alcohol use 3.6 oz/week    6 Cans of beer per week     Comment: about 6 drinks per week   Family History  Problem Relation Age of Onset  . Breast cancer Mother   . Thyroid cancer Father   . Hypertension Father   . Parkinson's disease Father   . Colon cancer Neg Hx      Current  medication list and allergy/intolerance information reviewed:   Current Outpatient Prescriptions  Medication Sig Dispense Refill  . albuterol (PROVENTIL HFA;VENTOLIN HFA) 108 (90 BASE) MCG/ACT inhaler Inhale 2 puffs into the lungs every 6 (six) hours as needed for wheezing or shortness of breath. 1 Inhaler 2  . aspirin EC 81 MG tablet Take 81 mg by mouth daily.    Marland Kitchen. losartan-hydrochlorothiazide (HYZAAR) 100-25 MG tablet TAKE ONE TABLET BY MOUTH DAILY 90 tablet 3  . omeprazole (PRILOSEC OTC) 20 MG tablet Take 1 tablet (20 mg total) by mouth daily. 90 tablet 3  . sildenafil (REVATIO) 20 MG tablet One to four tabs by mouth daily as needed for sex. 50 tablet 0   No current facility-administered medications for this visit.    Allergies  Allergen Reactions  . No Known Allergies       Review of Systems:  Constitutional:  No  fever, no chills, No recent illness, No unintentional weight changes. No significant fatigue.   HEENT: No  headache, no vision change, no hearing change, No sore throat, No  sinus pressure  Cardiac: No  chest pain, No  pressure, No palpitations  Respiratory:  No  shortness of breath. No  Cough  Gastrointestinal: No  abdominal pain, No  nausea, No  vomiting,  No  blood in stool, No  diarrhea, No  constipation   Musculoskeletal: No new myalgia/arthralgia  Skin: No  Rash, No other wounds/concerning lesions  Hem/Onc: No  easy bruising/bleeding  Endocrine: No cold intolerance,  No heat intolerance.   Neurologic: No  weakness, No  dizziness,  Psychiatric: No  concerns with depression, No  concerns with anxiety, No sleep problems, No mood problems  Exam:  BP (!) 152/90   Pulse 64   Wt 203 lb (92.1 kg)   BMI 30.87 kg/m   Constitutional: VS see above. General Appearance: alert, well-developed, well-nourished, NAD  Eyes: Normal lids and conjunctive, non-icteric sclera  Ears, Nose, Mouth, Throat: MMM, Normal external inspection ears/nares/mouth/lips/gums. TM  normal bilaterally. Pharynx/tonsils no erythema, no exudate. Nasal mucosa normal.   Neck: No masses, trachea midline. No thyroid enlargement. No tenderness/mass appreciated. No lymphadenopathy  Respiratory: Normal respiratory effort. no wheeze, no rhonchi, no rales  Cardiovascular: S1/S2 normal, no murmur, no rub/gallop auscultated. RRR. No lower extremity edema. Pedal pulse II/IV bilaterally DP and PT. No carotid bruit or JVD. No abdominal aortic bruit.  Gastrointestinal: Nontender, no masses. No hepatomegaly, no splenomegaly. No hernia appreciated. Bowel sounds normal. Rectal exam deferred.   Musculoskeletal: Gait normal. No clubbing/cyanosis of digits.   Neurological: Normal balance/coordination. No tremor. No cranial nerve deficit on limited exam. Motor and sensation intact and symmetric. Cerebellar reflexes intact.   Skin: warm, dry, intact. No rash/ulcer. No concerning nevi or subq nodules on limited exam.    Psychiatric: Normal judgment/insight. Normal mood and affect. Oriented x3.      ASSESSMENT/PLAN:   Annual physical exam - Plan: CBC, COMPLETE METABOLIC PANEL WITH GFR, Lipid panel, TSH  Essential hypertension  Need for immunization against influenza - Plan: Flu Vaccine QUAD 36+ mos IM   Patient Instructions   Plan to return for nurse visit to verify home blood pressure cuff. In the meantime, be keeping a record of you rblood pressures at home and bring this with you to the visit with the nurse.   If your cuff is measuring within 5-10 points of ours AND your home numbers are less than less than 130/80, then nothing else to do.   If your home blood pressure cuff is inaccurate or is accurate but measuring above goal, we will need to talk about adjusting your medications.         MALE PREVENTIVE CARE  updated 02/08/17  ANNUAL SCREENING/COUNSELING  Any changes to health in the past year? no  Diet/Exercise - HEALTHY HABITS DISCUSSED TO DECREASE CV RISK History   Smoking Status  . Former Smoker  . Packs/day: 2.00  . Years: 30.00  . Types: Cigarettes  . Quit date: 04/18/1983  Smokeless Tobacco  . Never Used   History  Alcohol Use  . 3.6 oz/week  . 6 Cans of beer per week    Comment: about 6 drinks per week   Depression screen [facility] 2/9 08/10/2016  Decreased Interest 0  Down, Depressed, Hopeless 0  PHQ - 2 Score 0  Altered sleeping 0  Tired, decreased energy 0  Change in appetite 0  Feeling bad or failure about yourself  0  Trouble concentrating 0  Moving slowly or fidgety/restless 0  Suicidal thoughts 0  PHQ-9 Score 0    SEXUAL/REPRODUCTIVE HEALTH  Sexually active in the past year? - Yes with male.  STI testing needed/desired today? - no  Any concerns with testosterone/libido? - no  INFECTIOUS DISEASE SCREENING  HIV - does not need  GC/CT - does not need  HepC - does not need  TB - does not  need  CANCER SCREENING  Lung - does not need  Colon - does not need  Prostate - does not need  OTHER DISEASE SCREENING  Lipid - needs  DM2 - needs  AAA - 65-75yo ever smoked: does not need  Osteoporosis - men 64yo+ - does not need  ADULT VACCINATION  Influenza - annual vaccine recommended  Td - booster every 10 years   Zoster - Shingrix recommended 33+ years old  PCV13 - was not indicated  PPSV23 - was not indicated Immunization History  Administered Date(s) Administered  . Influenza Split 03/30/2011, 01/16/2012  . Influenza Whole 03/01/2009, 01/24/2010  . Influenza,inj,Quad PF,6+ Mos 02/17/2013, 01/11/2015, 02/10/2016, 02/08/2017  . Tdap 02/10/2016            Visit summary with medication list and pertinent instructions was printed for patient to review. All questions at time of visit were answered - patient instructed to contact office with any additional concerns. ER/RTC precautions were reviewed with the patient. Follow-up plan: Return in about 2 weeks (around 02/22/2017) for nurse visit BP  check.

## 2017-02-16 LAB — CBC
HEMATOCRIT: 45 % (ref 38.5–50.0)
HEMOGLOBIN: 15.5 g/dL (ref 13.2–17.1)
MCH: 30.6 pg (ref 27.0–33.0)
MCHC: 34.4 g/dL (ref 32.0–36.0)
MCV: 88.9 fL (ref 80.0–100.0)
MPV: 9.1 fL (ref 7.5–12.5)
Platelets: 214 10*3/uL (ref 140–400)
RBC: 5.06 10*6/uL (ref 4.20–5.80)
RDW: 12.5 % (ref 11.0–15.0)
WBC: 5.7 10*3/uL (ref 3.8–10.8)

## 2017-02-16 LAB — COMPLETE METABOLIC PANEL WITH GFR
AG Ratio: 1.8 (calc) (ref 1.0–2.5)
ALT: 23 U/L (ref 9–46)
AST: 19 U/L (ref 10–35)
Albumin: 4.3 g/dL (ref 3.6–5.1)
Alkaline phosphatase (APISO): 64 U/L (ref 40–115)
BUN/Creatinine Ratio: 11 (calc) (ref 6–22)
BUN: 17 mg/dL (ref 7–25)
CALCIUM: 9.4 mg/dL (ref 8.6–10.3)
CO2: 29 mmol/L (ref 20–32)
CREATININE: 1.52 mg/dL — AB (ref 0.70–1.25)
Chloride: 101 mmol/L (ref 98–110)
GFR, EST NON AFRICAN AMERICAN: 48 mL/min/{1.73_m2} — AB (ref >=60)
GFR, Est African American: 55 mL/min/{1.73_m2} — ABNORMAL LOW (ref >=60)
GLOBULIN: 2.4 g/dL (ref 1.9–3.7)
GLUCOSE: 94 mg/dL (ref 65–99)
Potassium: 4.4 mmol/L (ref 3.5–5.3)
SODIUM: 140 mmol/L (ref 135–146)
Total Bilirubin: 0.8 mg/dL (ref 0.2–1.2)
Total Protein: 6.7 g/dL (ref 6.1–8.1)

## 2017-02-16 LAB — LIPID PANEL
CHOL/HDL RATIO: 3.8 (calc) (ref <5.0)
CHOLESTEROL: 156 mg/dL (ref <200)
HDL: 41 mg/dL (ref >40)
LDL CHOLESTEROL (CALC): 89 mg/dL
NON-HDL CHOLESTEROL (CALC): 115 mg/dL (ref <130)
TRIGLYCERIDES: 156 mg/dL — AB (ref <150)

## 2017-02-16 LAB — TSH: TSH: 2.14 mIU/L (ref 0.40–4.50)

## 2017-02-20 ENCOUNTER — Other Ambulatory Visit: Payer: Self-pay | Admitting: Osteopathic Medicine

## 2017-02-20 DIAGNOSIS — N183 Chronic kidney disease, stage 3 unspecified: Secondary | ICD-10-CM | POA: Insufficient documentation

## 2017-02-20 NOTE — Progress Notes (Unsigned)
F/u renal fxn

## 2017-02-23 ENCOUNTER — Ambulatory Visit (INDEPENDENT_AMBULATORY_CARE_PROVIDER_SITE_OTHER): Payer: Managed Care, Other (non HMO) | Admitting: Physician Assistant

## 2017-02-23 VITALS — BP 134/79 | HR 67

## 2017-02-23 DIAGNOSIS — I10 Essential (primary) hypertension: Secondary | ICD-10-CM

## 2017-02-23 NOTE — Progress Notes (Signed)
Pt came into clinic today for BP check. He brought his home machine, and home readings. Our machine reading: 134/79 (67), home machine: 133/84(66). Pt denies any chest pain or palpitations. does state he is about to retire at the end of the month so his stress is about to decrease. Advised Pt to continue his current treatment plan and we would contact him with any changes. Verbalized understanding, no further questions.

## 2017-04-19 ENCOUNTER — Other Ambulatory Visit: Payer: Self-pay | Admitting: Osteopathic Medicine

## 2017-04-19 DIAGNOSIS — I10 Essential (primary) hypertension: Secondary | ICD-10-CM

## 2017-07-19 ENCOUNTER — Other Ambulatory Visit: Payer: Self-pay | Admitting: Sports Medicine

## 2017-07-19 MED ORDER — VALSARTAN-HYDROCHLOROTHIAZIDE 320-25 MG PO TABS: 1.0000 | ORAL_TABLET | Freq: Every day | ORAL | 3 refills | Status: DC

## 2017-07-19 NOTE — Telephone Encounter (Signed)
Switching to valsartan/HCTZ, losartan/HCTZ is on national back order.

## 2017-12-02 ENCOUNTER — Encounter: Payer: Self-pay | Admitting: Osteopathic Medicine

## 2017-12-03 ENCOUNTER — Other Ambulatory Visit: Payer: Self-pay | Admitting: Physician Assistant

## 2017-12-03 MED ORDER — VALSARTAN 80 MG PO TABS: 80.0000 mg | ORAL_TABLET | Freq: Every day | ORAL | 1 refills | Status: DC

## 2017-12-20 ENCOUNTER — Encounter: Payer: Self-pay | Admitting: Osteopathic Medicine

## 2017-12-20 DIAGNOSIS — N529 Male erectile dysfunction, unspecified: Secondary | ICD-10-CM

## 2017-12-20 MED ORDER — SILDENAFIL CITRATE 20 MG PO TABS: ORAL_TABLET | ORAL | 0 refills | Status: DC

## 2018-01-02 ENCOUNTER — Encounter: Payer: Self-pay | Admitting: Osteopathic Medicine

## 2018-01-21 ENCOUNTER — Encounter: Payer: Self-pay | Admitting: Osteopathic Medicine

## 2018-01-21 MED ORDER — ALBUTEROL SULFATE HFA 108 (90 BASE) MCG/ACT IN AERS: 2.0000 | INHALATION_SPRAY | Freq: Four times a day (QID) | RESPIRATORY_TRACT | 99 refills | Status: DC | PRN

## 2018-01-22 DIAGNOSIS — Z23 Encounter for immunization: Secondary | ICD-10-CM | POA: Diagnosis not present

## 2018-01-22 MED ORDER — ALBUTEROL SULFATE HFA 108 (90 BASE) MCG/ACT IN AERS: 2.0000 | INHALATION_SPRAY | Freq: Four times a day (QID) | RESPIRATORY_TRACT | 3 refills | Status: DC | PRN

## 2018-01-22 NOTE — Telephone Encounter (Signed)
Could not find pharmacy in Epic. Called Pt, he said it is called [facility] [employer]. Rx sent. Pt thankful.

## 2018-01-22 NOTE — Addendum Note (Signed)
Addended by: Collie Siad on: 01/22/2018 08:36 AM   Modules accepted: Orders

## 2018-02-01 ENCOUNTER — Encounter: Payer: Self-pay | Admitting: Osteopathic Medicine

## 2018-02-04 MED ORDER — VALSARTAN 80 MG PO TABS: 80.0000 mg | ORAL_TABLET | Freq: Every day | ORAL | 0 refills | Status: DC

## 2018-02-04 NOTE — Addendum Note (Signed)
Addended by: Collie Siad on: 02/04/2018 11:26 AM   Modules accepted: Orders

## 2018-02-04 NOTE — Telephone Encounter (Signed)
90 day sent per OK from PCP

## 2018-02-25 ENCOUNTER — Encounter: Payer: Self-pay | Admitting: Osteopathic Medicine

## 2018-02-25 DIAGNOSIS — N529 Male erectile dysfunction, unspecified: Secondary | ICD-10-CM

## 2018-02-26 MED ORDER — SILDENAFIL CITRATE 20 MG PO TABS: ORAL_TABLET | ORAL | 0 refills | Status: DC

## 2018-02-26 MED ORDER — VALSARTAN 80 MG PO TABS: 80.0000 mg | ORAL_TABLET | Freq: Every day | ORAL | 0 refills | Status: DC

## 2018-03-18 ENCOUNTER — Telehealth: Payer: Self-pay | Admitting: Osteopathic Medicine

## 2018-03-18 DIAGNOSIS — Z0000 Encounter for general adult medical examination without abnormal findings: Secondary | ICD-10-CM

## 2018-03-18 DIAGNOSIS — N183 Chronic kidney disease, stage 3 unspecified: Secondary | ICD-10-CM

## 2018-03-18 DIAGNOSIS — I10 Essential (primary) hypertension: Secondary | ICD-10-CM

## 2018-03-18 DIAGNOSIS — N529 Male erectile dysfunction, unspecified: Secondary | ICD-10-CM

## 2018-03-18 NOTE — Telephone Encounter (Signed)
Patient wants to have labs done prior to physical 04/22/2018. Please advise.

## 2018-03-18 NOTE — Telephone Encounter (Signed)
Labs pended for PCP approval  

## 2018-03-19 NOTE — Telephone Encounter (Signed)
Orders reviewed and signed.  Thanks.

## 2018-03-19 NOTE — Telephone Encounter (Signed)
Pt advised.

## 2018-03-29 ENCOUNTER — Encounter: Payer: Self-pay | Admitting: Osteopathic Medicine

## 2018-03-29 MED ORDER — AMOXICILLIN 500 MG PO CAPS: 2000.0000 mg | ORAL_CAPSULE | Freq: Once | ORAL | 0 refills | Status: AC

## 2018-04-04 DIAGNOSIS — N529 Male erectile dysfunction, unspecified: Secondary | ICD-10-CM | POA: Diagnosis not present

## 2018-04-04 DIAGNOSIS — N183 Chronic kidney disease, stage 3 (moderate): Secondary | ICD-10-CM | POA: Diagnosis not present

## 2018-04-04 DIAGNOSIS — Z0000 Encounter for general adult medical examination without abnormal findings: Secondary | ICD-10-CM | POA: Diagnosis not present

## 2018-04-04 DIAGNOSIS — I10 Essential (primary) hypertension: Secondary | ICD-10-CM | POA: Diagnosis not present

## 2018-04-05 ENCOUNTER — Encounter: Payer: Self-pay | Admitting: Osteopathic Medicine

## 2018-04-05 ENCOUNTER — Ambulatory Visit (INDEPENDENT_AMBULATORY_CARE_PROVIDER_SITE_OTHER): Payer: Medicare Other | Admitting: Osteopathic Medicine

## 2018-04-05 VITALS — BP 138/77 | HR 52 | Temp 97.7°F | Wt 200.0 lb

## 2018-04-05 DIAGNOSIS — Z87891 Personal history of nicotine dependence: Secondary | ICD-10-CM

## 2018-04-05 DIAGNOSIS — N529 Male erectile dysfunction, unspecified: Secondary | ICD-10-CM

## 2018-04-05 DIAGNOSIS — I10 Essential (primary) hypertension: Secondary | ICD-10-CM | POA: Diagnosis not present

## 2018-04-05 DIAGNOSIS — Z23 Encounter for immunization: Secondary | ICD-10-CM

## 2018-04-05 LAB — CBC
HCT: 46.1 % (ref 38.5–50.0)
Hemoglobin: 15.7 g/dL (ref 13.2–17.1)
MCH: 30.5 pg (ref 27.0–33.0)
MCHC: 34.1 g/dL (ref 32.0–36.0)
MCV: 89.5 fL (ref 80.0–100.0)
MPV: 9.8 fL (ref 7.5–12.5)
Platelets: 224 10*3/uL (ref 140–400)
RBC: 5.15 10*6/uL (ref 4.20–5.80)
RDW: 12.4 % (ref 11.0–15.0)
WBC: 6 10*3/uL (ref 3.8–10.8)

## 2018-04-05 LAB — COMPREHENSIVE METABOLIC PANEL
AG Ratio: 2.1 (calc) (ref 1.0–2.5)
ALBUMIN MSPROF: 4.4 g/dL (ref 3.6–5.1)
ALT: 17 U/L (ref 9–46)
AST: 16 U/L (ref 10–35)
Alkaline phosphatase (APISO): 73 U/L (ref 40–115)
BUN/Creatinine Ratio: 13 (calc) (ref 6–22)
BUN: 18 mg/dL (ref 7–25)
CO2: 29 mmol/L (ref 20–32)
Calcium: 9.5 mg/dL (ref 8.6–10.3)
Chloride: 105 mmol/L (ref 98–110)
Creat: 1.35 mg/dL — ABNORMAL HIGH (ref 0.70–1.25)
Globulin: 2.1 g/dL (calc) (ref 1.9–3.7)
Glucose, Bld: 96 mg/dL (ref 65–99)
Potassium: 4.7 mmol/L (ref 3.5–5.3)
Sodium: 142 mmol/L (ref 135–146)
Total Bilirubin: 0.5 mg/dL (ref 0.2–1.2)
Total Protein: 6.5 g/dL (ref 6.1–8.1)

## 2018-04-05 LAB — LIPID PANEL
CHOL/HDL RATIO: 3.8 (calc) (ref <5.0)
Cholesterol: 174 mg/dL (ref <200)
HDL: 46 mg/dL (ref >40)
LDL Cholesterol (Calc): 104 mg/dL (calc) — ABNORMAL HIGH
Non-HDL Cholesterol (Calc): 128 mg/dL (calc) (ref <130)
Triglycerides: 139 mg/dL (ref <150)

## 2018-04-05 LAB — TSH: TSH: 2.81 mIU/L (ref 0.40–4.50)

## 2018-04-05 LAB — PSA: PSA: 1.4 ng/mL (ref <=4.0)

## 2018-04-05 MED ORDER — SILDENAFIL CITRATE 20 MG PO TABS: ORAL_TABLET | ORAL | 3 refills | Status: DC

## 2018-04-05 MED ORDER — ZOSTER VAC RECOMB ADJUVANTED 50 MCG/0.5ML IM SUSR: 0.5000 mL | Freq: Once | INTRAMUSCULAR | 1 refills | Status: AC

## 2018-04-05 MED ORDER — VALSARTAN 80 MG PO TABS: 80.0000 mg | ORAL_TABLET | Freq: Every day | ORAL | 3 refills | Status: DC

## 2018-04-05 NOTE — Patient Instructions (Addendum)
General Preventive Care  Most recent routine screening lipids/other labs: already done!   Everyone should have blood pressure checked once per year.   Tobacco: don't!   Alcohol: responsible moderation is ok for most adults - if you have concerns about your alcohol intake, please talk to me!   Exercise: as tolerated to reduce risk of cardiovascular disease and diabetes. Strength training will also prevent osteoporosis.   Mental health: if need for mental health care (medicines, counseling, other), or concerns about moods, please let me know!   Sexual health: if need for STD testing, or if concerns with libido/pain problems, please let me know!  Advanced Directive: Living Will and/or Healthcare Power of Attorney recommended for all adults, regardless of age or health. We are happy to keep a copy on file for you here in the office.  Vaccines  Flu vaccine: recommended for almost everyone, every fall.   Shingles vaccine: Shingrix recommended after age 65  Pneumonia vaccine: done today  Tetanus booster: Tdap recommended every 10 years. Done 2017 Cancer screenings   Colon cancer screening: due age 65   Prostate cancer screening: PSA blood test age 65-71  Lung cancer screening: not needed since you quit >15 years ago  Infection screenings . HIV & Gonorrhea/Chlamydia: screening as needed . Hepatitis C: once, already done (recommended for anyone born [id number]) . TB: certain at-risk populations, or depending on work requirements and/or travel history Other . Bone Density Test: recommended for men at age 570, sooner depending on risk factors . Abdominal Aortic Aneurysm: screening with ultrasound recommended once for men age 65-75 who have ever smoked - I put in order for this.

## 2018-04-05 NOTE — Progress Notes (Signed)
HPI: Trevor Watson is a 66 y.o. male who  has a past medical history of Allergy, Anxiety, Arthritis, Asthma, Bronchitis, Complication of anesthesia, GERD (gastroesophageal reflux disease), History of inguinal hernia repair, Hypertension, IBS (irritable bowel syndrome), PONV (postoperative nausea and vomiting), Recurrent boils, and Thyroid nodule.  he presents to [facility] today, 04/05/18,  for chief complaint of: CHECK-UP, MED REFILLS  HTN: controlled on current meds  ED: sildenafil as needed, doing well  Hx asthma: inhaler as needed, rarely used  See below for review preventive care   Past medical, surgical, social and family history reviewed:  Patient Active Problem List   Diagnosis Date Noted  . Chronic kidney disease (CKD), stage III (moderate) (HCC) 02/20/2017  . Concern about skin disease without diagnosis 08/10/2016  . Pain of foot 08/10/2016  . Primary localized osteoarthritis of left hip 01/05/2016  . Airway hyperreactivity 03/16/2014  . Thyroid nodule 12/03/2013  . Hyperglycemia 12/03/2013  . Recurrent boils 12/03/2013  . Tremor 12/03/2013  . Degenerative joint disease of hand 12/03/2013  . Hypertension 10/11/2011  . Snoring 03/30/2011  . Asthmatic bronchitis 03/30/2011  . INGUINAL HERNIA 03/14/2009  . GERD 03/01/2009  . IRRITABLE BOWEL SYNDROME 03/01/2009  . Adaptive colitis 03/01/2009    Past Surgical History:  Procedure Laterality Date  . LAPAROSCOPIC CHOLECYSTECTOMY  1996  . left inguinal hernia repair  age 16  . right inguinl hernia repair w/ mesh  09/07   Dr. Janee Morn  . 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 Smoking status: Former Smoker    Packs/day: 2.00    Years: 30.00    Pack years: 60.00    Types: Cigarettes    Last attempt  to quit: 04/18/1983    Years since quitting: 34.9  . Smokeless tobacco: Never Used  Substance Use Topics  . Alcohol use: Yes    Alcohol/week: 6.0 standard drinks    Types: 6 Cans of beer per week    Comment: about 6 drinks per week    Family History  Problem Relation Age of Onset  . Breast cancer Mother   . Thyroid cancer Father   . Hypertension Father   . Parkinson's disease Father   . Colon cancer Neg Hx      Current medication list and allergy/intolerance information reviewed:    Current Outpatient Medications  Medication Sig Dispense Refill  . albuterol (PROVENTIL HFA;VENTOLIN HFA) 108 (90 Base) MCG/ACT inhaler Inhale 2 puffs into the lungs every 6 (six) hours as needed for wheezing or shortness of breath. 2 Inhaler 3  . aspirin EC 81 MG tablet Take 81 mg by mouth daily.    Marland Kitchen omeprazole (PRILOSEC OTC) 20 MG tablet Take 1 tablet (20 mg total) by mouth daily. 90 tablet 3  . sildenafil (REVATIO) 20 MG tablet One to four tabs by mouth daily as needed for sex. 50 tablet 0  . valsartan (DIOVAN) 80 MG tablet Take 1 tablet (80 mg total) by mouth daily. Due for follow up 90 tablet 0   No current facility-administered medications for this visit.     Allergies  Allergen Reactions  . No Known Allergies       Review of Systems:  Constitutional:  No  fever, no chills, No recent illness, No unintentional weight changes. No significant  fatigue.   HEENT: No  headache, no vision change, no hearing change, No sore throat, No  sinus pressure  Cardiac: No  chest pain, No  pressure, No palpitations, No  Orthopnea  Respiratory:  No  shortness of breath. No  Cough  Gastrointestinal: No  abdominal pain, No  nausea, No  vomiting,  No  blood in stool, No  diarrhea, No  constipation   Musculoskeletal: No new myalgia/arthralgia  Skin: No  Rash, No other wounds/concerning lesions  Genitourinary: No  incontinence, No  abnormal genital bleeding, No abnormal genital discharge  Hem/Onc: No   easy bruising/bleeding, No  abnormal lymph node  Endocrine: No cold intolerance,  No heat intolerance. No polyuria/polydipsia/polyphagia   Neurologic: No  weakness, No  dizziness, No  slurred speech/focal weakness/facial droop  Psychiatric: No  concerns with depression, No  concerns with anxiety, No sleep problems, No mood problems  Exam:  BP 138/77 (BP Location: Left Arm, Patient Position: Sitting, Cuff Size: Normal)   Pulse (!) 52   Temp 97.7 F (36.5 C) (Oral)   Wt 200 lb (90.7 kg)   BMI 30.41 kg/m   Constitutional: VS see above. General Appearance: alert, well-developed, well-nourished, NAD  Eyes: Normal lids and conjunctive, non-icteric sclera  Ears, Nose, Mouth, Throat: MMM, Normal external inspection ears/nares/mouth/lips/gums. TM normal bilaterally. Pharynx/tonsils no erythema, no exudate. Nasal mucosa normal.   Neck: No masses, trachea midline. No thyroid enlargement. No tenderness/mass appreciated. No lymphadenopathy  Respiratory: Normal respiratory effort. no wheeze, no rhonchi, no rales  Cardiovascular: S1/S2 normal, no murmur, no rub/gallop auscultated. RRR. No lower extremity edema.   Gastrointestinal: Nontender, no masses. No hepatomegaly, no splenomegaly. No hernia appreciated. Bowel sounds normal. Rectal exam deferred.   Musculoskeletal: Gait normal. No clubbing/cyanosis of digits.   Neurological: Normal balance/coordination. No tremor. No cranial nerve deficit on limited exam. Motor and sensation intact and symmetric. Cerebellar reflexes intact.   Skin: warm, dry, intact. No rash/ulcer.   Psychiatric: Normal judgment/insight. Normal mood and affect. Oriented x3.    Results for orders placed or performed in visit on 03/18/18 (from the past 72 hour(s))  CBC     Status: None   Collection Time: 04/04/18  8:09 AM  Result Value Ref Range   WBC 6.0 3.8 - 10.8 Thousand/uL   RBC 5.15 4.20 - 5.80 Million/uL   Hemoglobin 15.7 13.2 - 17.1 g/dL   HCT 16.146.1 09.638.5 -  04.550.0 %   MCV 89.5 80.0 - 100.0 fL   MCH 30.5 27.0 - 33.0 pg   MCHC 34.1 32.0 - 36.0 g/dL   RDW 40.912.4 81.111.0 - 91.415.0 %   Platelets 224 140 - 400 Thousand/uL   MPV 9.8 7.5 - 12.5 fL  Comprehensive Metabolic Panel (CMET)     Status: Abnormal   Collection Time: 04/04/18  8:09 AM  Result Value Ref Range   Glucose, Bld 96 65 - 99 mg/dL    Comment: .            Fasting reference interval .    BUN 18 7 - 25 mg/dL   Creat 7.821.35 (H) 9.560.70 - 1.25 mg/dL    Comment: For patients >65 years of age, the reference limit for Creatinine is approximately 13% higher for people identified as African-American. .    BUN/Creatinine Ratio 13 6 - 22 (calc)   Sodium 142 135 - 146 mmol/L   Potassium 4.7 3.5 - 5.3 mmol/L   Chloride 105 98 - 110 mmol/L   CO2 29 20 -  32 mmol/L   Calcium 9.5 8.6 - 10.3 mg/dL   Total Protein 6.5 6.1 - 8.1 g/dL   Albumin 4.4 3.6 - 5.1 g/dL   Globulin 2.1 1.9 - 3.7 g/dL (calc)   AG Ratio 2.1 1.0 - 2.5 (calc)   Total Bilirubin 0.5 0.2 - 1.2 mg/dL   Alkaline phosphatase (APISO) 73 40 - 115 U/L   AST 16 10 - 35 U/L   ALT 17 9 - 46 U/L  Lipid Profile     Status: Abnormal   Collection Time: 04/04/18  8:09 AM  Result Value Ref Range   Cholesterol 174 <200 mg/dL   HDL 46 >16 mg/dL   Triglycerides 109 <604 mg/dL   LDL Cholesterol (Calc) 104 (H) mg/dL (calc)    Comment: Reference range: <100 . Desirable range <100 mg/dL for primary prevention;   <70 mg/dL for patients with CHD or diabetic patients  with > or = 2 CHD risk factors. Marland Kitchen LDL-C is now calculated using the Martin-Hopkins  calculation, which is a validated novel method providing  better accuracy than the Friedewald equation in the  estimation of LDL-C.  Horald Pollen et al. Lenox Ahr. 5409;811(91): 2061-2068  (http://education.[facility].com/faq/FAQ164)    Total CHOL/HDL Ratio 3.8 <5.0 (calc)   Non-HDL Cholesterol (Calc) 128 <130 mg/dL (calc)    Comment: For patients with diabetes plus 1 major ASCVD risk  factor,  treating to a non-HDL-C goal of <100 mg/dL  (LDL-C of <47 mg/dL) is considered a therapeutic  option.   PSA     Status: None   Collection Time: 04/04/18  8:09 AM  Result Value Ref Range   PSA 1.4 < OR = 4.0 ng/mL    Comment: The total PSA value from this assay system is  standardized against the WHO standard. The test  result will be approximately 20% lower when compared  to the equimolar-standardized total PSA (Beckman  Coulter). Comparison of serial PSA results should be  interpreted with this fact in mind. . This test was performed using the Siemens  chemiluminescent method. Values obtained from  different assay methods cannot be used interchangeably. PSA levels, regardless of value, should not be interpreted as absolute evidence of the presence or absence of disease.   TSH     Status: None   Collection Time: 04/04/18  8:09 AM  Result Value Ref Range   TSH 2.81 0.40 - 4.50 mIU/L    No results found.      ASSESSMENT/PLAN: The primary encounter diagnosis was Essential hypertension. Diagnoses of Former smoker, Erectile dysfunction, unspecified erectile dysfunction type, and Need for pneumococcal vaccine were also pertinent to this visit.   Orders Placed This Encounter  Procedures  . US ABDOMINAL AORTA SCREENING AAA  . Pneumococcal polysaccharide vaccine 23-valent greater than or equal to 2yo subcutaneous/IM    Meds ordered this encounter  Medications  . Zoster Vaccine Adjuvanted [facility]) injection    Sig: Inject 0.5 mLs into the muscle once for 1 dose. Repeat in 2 to 6 months    Dispense:  0.5 mL    Refill:  1  . sildenafil (REVATIO) 20 MG tablet    Sig: One to four tabs by mouth daily as needed for sex.    Dispense:  50 tablet    Refill:  3  . valsartan (DIOVAN) 80 MG tablet    Sig: Take 1 tablet (80 mg total) by mouth daily.    Dispense:  90 tablet    Refill:  3   The  10-year ASCVD risk score Denman George(Goff [place] Montez HagemanJr., et al., 2013) is: 15.9%   Values used to  calculate the score:     Age: [age] years     Sex: Male     Is Non-Hispanic African American: No     Diabetic: No     Tobacco smoker: No     Systolic Blood Pressure: 138 mmHg     Is BP treated: Yes     HDL Cholesterol: 46 mg/dL     Total Cholesterol: 174 mg/dL   Immunization History  Administered Date(s) Administered  . Influenza Split 03/30/2011, 01/16/2012  . Influenza Whole 03/01/2009, 01/24/2010  . Influenza,inj,Quad PF,6+ Mos 02/17/2013, 01/11/2015, 02/10/2016, 02/08/2017  . Influenza-Unspecified 03/16/2018  . Tdap 02/10/2016     Patient Instructions  General Preventive Care  Most recent routine screening lipids/other labs: already done!   Everyone should have blood pressure checked once per year.   Tobacco: don't!   Alcohol: responsible moderation is ok for most adults - if you have concerns about your alcohol intake, please talk to me!   Exercise: as tolerated to reduce risk of cardiovascular disease and diabetes. Strength training will also prevent osteoporosis.   Mental health: if need for mental health care (medicines, counseling, other), or concerns about moods, please let me know!   Sexual health: if need for STD testing, or if concerns with libido/pain problems, please let me know!  Advanced Directive: Living Will and/or Healthcare Power of Attorney recommended for all adults, regardless of age or health. We are happy to keep a copy on file for you here in the office.  Vaccines  Flu vaccine: recommended for almost everyone, every fall.   Shingles vaccine: Shingrix recommended after age 65  Pneumonia vaccine: done today  Tetanus booster: Tdap recommended every 10 years. Done 2017 Cancer screenings   Colon cancer screening: due age 65   Prostate cancer screening: PSA blood test age 65-71  Lung cancer screening: not needed since you quit >15 years ago  Infection screenings . HIV & Gonorrhea/Chlamydia: screening as needed . Hepatitis C: once, already  done (recommended for anyone born 251945-1963) . TB: certain at-risk populations, or depending on work requirements and/or travel history Other . Bone Density Test: recommended for men at age 65, sooner depending on risk factors . Abdominal Aortic Aneurysm: screening with ultrasound recommended once for men age 65-75 who have ever smoked - I put in order for this.        Visit summary with medication list and pertinent instructions was printed for patient to review. All questions at time of visit were answered - patient instructed to contact office with any additional concerns or updates. ER/RTC precautions were reviewed with the patient.   Note: Total time spent 25 minutes, greater than 50% of the visit was spent face-to-face counseling and coordinating care for the above diagnoses listed in assessment/plan.   Please note: voice recognition software was used to produce this document, and typos may escape review. Please contact Dr. Lyn HollingsheadAlexander for any needed clarifications.     Follow-up plan: Return in about 1 year (around 04/06/2019) for annual check-up, or sooner if needed! .Marland Kitchen

## 2018-04-08 ENCOUNTER — Encounter: Payer: Managed Care, Other (non HMO) | Admitting: Osteopathic Medicine

## 2018-04-12 ENCOUNTER — Encounter: Payer: Self-pay | Admitting: Osteopathic Medicine

## 2018-04-12 ENCOUNTER — Ambulatory Visit (HOSPITAL_BASED_OUTPATIENT_CLINIC_OR_DEPARTMENT_OTHER)
Admission: RE | Admit: 2018-04-12 | Discharge: 2018-04-12 | Disposition: A | Discharge status: Home / Self Care | Payer: Medicare Other | Source: Ambulatory Visit | Attending: Osteopathic Medicine | Admitting: Osteopathic Medicine

## 2018-04-12 DIAGNOSIS — I700 Atherosclerosis of aorta: Secondary | ICD-10-CM | POA: Insufficient documentation

## 2018-04-12 DIAGNOSIS — I77811 Abdominal aortic ectasia: Secondary | ICD-10-CM

## 2018-04-12 DIAGNOSIS — Z87891 Personal history of nicotine dependence: Secondary | ICD-10-CM | POA: Insufficient documentation

## 2018-04-12 DIAGNOSIS — Z136 Encounter for screening for cardiovascular disorders: Secondary | ICD-10-CM | POA: Diagnosis not present

## 2018-04-12 HISTORY — DX: Abdominal aortic ectasia: I77.811

## 2018-04-22 ENCOUNTER — Encounter: Payer: Managed Care, Other (non HMO) | Admitting: Osteopathic Medicine

## 2018-05-06 ENCOUNTER — Encounter: Payer: Self-pay | Admitting: Osteopathic Medicine

## 2018-05-08 ENCOUNTER — Encounter: Payer: Self-pay | Admitting: Osteopathic Medicine

## 2018-05-08 DIAGNOSIS — I10 Essential (primary) hypertension: Secondary | ICD-10-CM | POA: Diagnosis not present

## 2018-05-09 MED ORDER — VALSARTAN 160 MG PO TABS: 160.0000 mg | ORAL_TABLET | Freq: Every day | ORAL | 3 refills | Status: DC

## 2018-05-26 ENCOUNTER — Encounter: Payer: Self-pay | Admitting: Osteopathic Medicine

## 2018-05-27 MED ORDER — HYDROCHLOROTHIAZIDE 25 MG PO TABS: 25.0000 mg | ORAL_TABLET | Freq: Every day | ORAL | 0 refills | Status: DC

## 2018-06-26 ENCOUNTER — Encounter: Payer: Self-pay | Admitting: Osteopathic Medicine

## 2018-06-26 ENCOUNTER — Ambulatory Visit (INDEPENDENT_AMBULATORY_CARE_PROVIDER_SITE_OTHER): Payer: Medicare Other | Admitting: Osteopathic Medicine

## 2018-06-26 ENCOUNTER — Other Ambulatory Visit: Payer: Self-pay

## 2018-06-26 VITALS — BP 143/73 | HR 63 | Temp 98.4°F | Wt 197.0 lb

## 2018-06-26 DIAGNOSIS — J4520 Mild intermittent asthma, uncomplicated: Secondary | ICD-10-CM | POA: Diagnosis not present

## 2018-06-26 DIAGNOSIS — I10 Essential (primary) hypertension: Secondary | ICD-10-CM | POA: Diagnosis not present

## 2018-06-26 MED ORDER — VALSARTAN 320 MG PO TABS: 320.0000 mg | ORAL_TABLET | Freq: Every day | ORAL | 1 refills | Status: DC

## 2018-06-26 MED ORDER — PREDNISONE 20 MG PO TABS: 20.0000 mg | ORAL_TABLET | Freq: Two times a day (BID) | ORAL | 1 refills | Status: DC

## 2018-06-26 MED ORDER — BUDESONIDE-FORMOTEROL FUMARATE 160-4.5 MCG/ACT IN AERO: 2.0000 | INHALATION_SPRAY | Freq: Two times a day (BID) | RESPIRATORY_TRACT | 3 refills | Status: DC

## 2018-06-26 NOTE — Patient Instructions (Signed)
For asthma: Prescription for steroids, prednisone, to start as needed if inhalers are not working.  Symbicort maintenance inhaler to start a couple of days before you know you are going to be in a place with dogs or other asthma triggers.  Continue albuterol use as needed.  For blood pressure:  Increased valsartan from 160 mg to 320 mg.  Please continue to keep track of your blood pressures and let me know through my chart in the next couple of weeks what things are looking like.  Call or message ASAP or seek emergency medical care as needed if dizziness, lightheadedness, or other concerning symptoms

## 2018-06-26 NOTE — Progress Notes (Signed)
HPI: Trevor Watson is a 68 y.o. male who  has a past medical history of Abdominal aortic ectasia (HCC) (04/12/2018), Allergy, Anxiety, Arthritis, Asthma, Bronchitis, Complication of anesthesia, GERD (gastroesophageal reflux disease), History of inguinal hernia repair, Hypertension, IBS (irritable bowel syndrome), PONV (postoperative nausea and vomiting), Recurrent boils, and Thyroid nodule.  he presents to [facility] today, 06/26/18,  for chief complaint of:  Asthma concerns   . Was around more dogs lately at his partner's daughter's house for 2 weeks. Was using albuterol 4-5 x/day only minimally helpful along w/ allergy medications.  Few days after leaving her house, was feeling fine. Inquires about meds to take as needed. DOgs seem to be his only significant trigger.  . BP diastolic average 81, systolic still 140's or higher often     At today's visit 06/26/18 ... PMH, PSH, FH reviewed and updated as needed.  Current medication list and allergy/intolerance hx reviewed and updated as needed. (See remainder of HPI, ROS, Phys Exam below)   No results found.  No results found for this or any previous visit (from the past 72 hour(s)).        ASSESSMENT/PLAN: The primary encounter diagnosis was Mild intermittent asthma without complication. A diagnosis of Essential hypertension was also pertinent to this visit.   Orders Placed This Encounter  Procedures  . COMPLETE METABOLIC PANEL WITH GFR     Meds ordered this encounter  Medications  . budesonide-formoterol (SYMBICORT) 160-4.5 MCG/ACT inhaler    Sig: Inhale 2 puffs into the lungs 2 (two) times daily.    Dispense:  1 Inhaler    Refill:  3  . predniSONE (DELTASONE) 20 MG tablet    Sig: Take 1 tablet (20 mg total) by mouth 2 (two) times daily with a meal for 5 days. As needed for asthma exacerbation    Dispense:  10 tablet    Refill:  1  . valsartan (DIOVAN) 320 MG tablet    Sig:  Take 1 tablet (320 mg total) by mouth daily.    Dispense:  90 tablet    Refill:  1    Cancel 160 mg dose thanks    Patient Instructions  For asthma: Prescription for steroids, prednisone, to start as needed if inhalers are not working.  Symbicort maintenance inhaler to start a couple of days before you know you are going to be in a place with dogs or other asthma triggers.  Continue albuterol use as needed.  For blood pressure:  Increased valsartan from 160 mg to 320 mg.  Please continue to keep track of your blood pressures and let me know through my chart in the next couple of weeks what things are looking like.  Call or message ASAP or seek emergency medical care as needed if dizziness, lightheadedness, or other concerning symptoms       Follow-up plan: Return for 03/2019 for annual physical - sooner if needed.                                                 ################################################# ################################################# ################################################# #################################################    Current Meds  Medication Sig  . albuterol (PROVENTIL HFA;VENTOLIN HFA) 108 (90 Base) MCG/ACT inhaler Inhale 2 puffs into the lungs every 6 (six) hours as needed for wheezing or shortness of breath.  . hydrochlorothiazide (HYDRODIURIL) 25 MG  tablet Take 1 tablet (25 mg total) by mouth daily.  Marland Kitchen omeprazole (PRILOSEC OTC) 20 MG tablet Take 1 tablet (20 mg total) by mouth daily.  . sildenafil (REVATIO) 20 MG tablet One to four tabs by mouth daily as needed for sex.  . valsartan (DIOVAN) 320 MG tablet Take 1 tablet (320 mg total) by mouth daily.  . [DISCONTINUED] valsartan (DIOVAN) 160 MG tablet Take 1 tablet (160 mg total) by mouth daily.    Allergies  Allergen Reactions  . No Known Allergies        Review of Systems:  Constitutional: No recent illness except as per  HPI  Cardiac: No  chest pain, No  pressure, No palpitations  Respiratory:  No  shortness of breath. No  Cough  Musculoskeletal: No new myalgia/arthralgia  Neurologic: No  weakness, No  Dizziness  Psychiatric: No  concerns with depression, No  concerns with anxiety  Exam:  BP (!) 143/73 (BP Location: Left Arm, Patient Position: Sitting, Cuff Size: Normal)   Pulse 63   Temp 98.4 F (36.9 C) (Oral)   Wt 197 lb (89.4 kg)   BMI 29.95 kg/m   Constitutional: VS see above. General Appearance: alert, well-developed, well-nourished, NAD  Eyes: Normal lids and conjunctive, non-icteric sclera  Ears, Nose, Mouth, Throat: MMM, Normal external inspection ears/nares/mouth/lips/gums.  Neck: No masses, trachea midline.   Respiratory: Normal respiratory effort. no wheeze, no rhonchi, no rales  Cardiovascular: S1/S2 normal, no murmur, no rub/gallop auscultated. RRR.   Musculoskeletal: Gait normal. Symmetric and independent movement of all extremities  Neurological: Normal balance/coordination. No tremor.  Skin: warm, dry, intact.   Psychiatric: Normal judgment/insight. Normal mood and affect. Oriented x3.       Visit summary with medication list and pertinent instructions was printed for patient to review, patient was advised to alert [place] if any updates are needed. All questions at time of visit were answered - patient instructed to contact office with any additional concerns. ER/RTC precautions were reviewed with the patient and understanding verbalized.     Please note: voice recognition software was used to produce this document, and typos may escape review. Please contact Dr. Lyn Hollingshead for any needed clarifications.    Follow up plan: Return for 03/2019 for annual physical - sooner if needed.

## 2018-06-27 ENCOUNTER — Ambulatory Visit: Payer: Medicare Other | Admitting: Osteopathic Medicine

## 2018-06-27 LAB — COMPLETE METABOLIC PANEL WITH GFR
AG Ratio: 1.8 (calc) (ref 1.0–2.5)
ALT: 25 U/L (ref 9–46)
AST: 21 U/L (ref 10–35)
Albumin: 4.4 g/dL (ref 3.6–5.1)
Alkaline phosphatase (APISO): 76 U/L (ref 35–144)
BUN/Creatinine Ratio: 14 (calc) (ref 6–22)
BUN: 20 mg/dL (ref 7–25)
CO2: 30 mmol/L (ref 20–32)
Calcium: 9.7 mg/dL (ref 8.6–10.3)
Chloride: 102 mmol/L (ref 98–110)
Creat: 1.41 mg/dL — ABNORMAL HIGH (ref 0.70–1.25)
GFR, Est African American: 60 mL/min/{1.73_m2} (ref >=60)
GFR, Est Non African American: 52 mL/min/{1.73_m2} — ABNORMAL LOW (ref >=60)
Globulin: 2.4 g/dL (calc) (ref 1.9–3.7)
Glucose, Bld: 88 mg/dL (ref 65–99)
Potassium: 4.4 mmol/L (ref 3.5–5.3)
Sodium: 140 mmol/L (ref 135–146)
Total Bilirubin: 0.4 mg/dL (ref 0.2–1.2)
Total Protein: 6.8 g/dL (ref 6.1–8.1)

## 2018-08-09 ENCOUNTER — Encounter: Payer: Self-pay | Admitting: Osteopathic Medicine

## 2018-08-21 ENCOUNTER — Encounter: Payer: Self-pay | Admitting: Osteopathic Medicine

## 2018-08-21 MED ORDER — HYDROCHLOROTHIAZIDE 25 MG PO TABS: 25.0000 mg | ORAL_TABLET | Freq: Every day | ORAL | 0 refills | Status: DC

## 2018-11-08 ENCOUNTER — Other Ambulatory Visit: Payer: Self-pay

## 2018-11-08 ENCOUNTER — Ambulatory Visit (INDEPENDENT_AMBULATORY_CARE_PROVIDER_SITE_OTHER): Payer: Medicare Other

## 2018-11-08 ENCOUNTER — Ambulatory Visit (INDEPENDENT_AMBULATORY_CARE_PROVIDER_SITE_OTHER): Payer: Medicare Other | Admitting: Sports Medicine

## 2018-11-08 DIAGNOSIS — S43006A Unspecified dislocation of unspecified shoulder joint, initial encounter: Secondary | ICD-10-CM

## 2018-11-08 DIAGNOSIS — S43002A Unspecified subluxation of left shoulder joint, initial encounter: Secondary | ICD-10-CM

## 2018-11-08 DIAGNOSIS — S43432A Superior glenoid labrum lesion of left shoulder, initial encounter: Secondary | ICD-10-CM | POA: Insufficient documentation

## 2018-11-08 DIAGNOSIS — M25512 Pain in left shoulder: Secondary | ICD-10-CM | POA: Diagnosis not present

## 2018-11-08 NOTE — Assessment & Plan Note (Signed)
With both a positive apprehension sign positive relocation sign. X-rays, MR arthrogram. Rehab exercises given, instructed to avoid positions of abduction and external rotation. I also instructed him on how to relocate the shoulder himself should it dislocate completely.

## 2018-11-08 NOTE — Progress Notes (Signed)
Subjective:    CC: Left shoulder injury  HPI: This is a very pleasant 68 year old male, he was cleaning his RV with an orbital buffer, it caught and jerked his left shoulder into abduction and external rotation.  He felt a pop, had immediate pain and inability to use the shoulder.  Soon thereafter he was able to move it again but has persistent pain in the posterior joint line when the shoulder is brought into abduction and external rotation, severe, localized without radiation.  I reviewed the past medical history, family history, social history, surgical history, and allergies today and no changes were needed.  Please see the problem list section below in epic for further details.  Past Medical History: Past Medical History:  Diagnosis Date  . Abdominal aortic ectasia ([place]) 04/12/2018   [place] 03/2018, f/u 3 years   . Allergy   . Anxiety   . Arthritis   . Asthma   . Bronchitis    recurrent  . Complication of anesthesia   . GERD (gastroesophageal reflux disease)   . History of inguinal hernia repair   . Hypertension   . IBS (irritable bowel syndrome)   . PONV (postoperative nausea and vomiting)    AFTER HIP SURGERY     12/2015  . Recurrent boils   . Thyroid nodule    checked every year has not changed   Past Surgical History: Past Surgical History:  Procedure Laterality Date  . LAPAROSCOPIC CHOLECYSTECTOMY  1996  . left inguinal hernia repair  age 60  . right inguinl hernia repair w/ mesh  09/07   Dr. Grandville Silos  . TOTAL HIP ARTHROPLASTY Left 01/05/2016   Procedure: TOTAL HIP ARTHROPLASTY ANTERIOR APPROACH;  Surgeon: Frederik Pear, MD;  Location: [facility];  Service: Orthopedics;  Laterality: Left;   Social History: Social History   Socioeconomic History  . Marital status: Married    Spouse name: Moshe Cipro  . Number of children: 3  . Years of education: Not on file  . Highest education level: Not on file  Occupational History  . Occupation: Aeronautical engineer: [employer]  Social Needs  . Financial resource strain: Not on file  . Food insecurity    Worry: Not on file    Inability: Not on file  . Transportation needs    Medical: Not on file    Non-medical: Not on file  Tobacco Use  . Smoking status: Former Smoker    Packs/day: 2.00    Years: 30.00    Pack years: 60.00    Types: Cigarettes    Quit date: 04/18/1983    Years since quitting: 35.5  . Smokeless tobacco: Never Used  Substance and Sexual Activity  . Alcohol use: Yes    Alcohol/week: 6.0 standard drinks    Types: 6 Cans of beer per week    Comment: about 6 drinks per week  . Drug use: No  . Sexual activity: Yes  Lifestyle  . Physical activity    Days per week: Not on file    Minutes per session: Not on file  . Stress: Not on file  Relationships  . Social Herbalist on phone: Not on file    Gets together: Not on file    Attends religious service: Not on file    Active member of club or organization: Not on file    Attends meetings of clubs or organizations: Not on file    Relationship status: Not on file  Other Topics Concern  . Not on file  Social History Narrative   Engaged to be married to Marsh & McLennanDeanna Potter Spring/Summer 2013   Family History: Family History  Problem Relation Age of Onset  . Breast cancer Mother   . Thyroid cancer Father   . Hypertension Father   . Parkinson's disease Father   . Colon cancer Neg Hx    Allergies: Allergies  Allergen Reactions  . No Known Allergies    Medications: See med rec.  Review of Systems: No fevers, chills, night sweats, weight loss, chest pain, or shortness of breath.   Objective:    General: Well Developed, well nourished, and in no acute distress.  Neuro: Alert and oriented x3, extra-ocular muscles intact, sensation grossly intact.  HEENT: Normocephalic, atraumatic, pupils equal round reactive to light, neck supple, no masses, no lymphadenopathy, thyroid nonpalpable.  Skin: Warm and dry, no rashes.  Cardiac: Regular rate and rhythm, no murmurs rubs or gallops, no lower extremity edema.  Respiratory: Clear to auscultation bilaterally. Not using accessory muscles, speaking in full sentences. Left shoulder: Inspection reveals no abnormalities, atrophy or asymmetry. Palpation is normal with no tenderness over AC joint or bicipital groove. ROM is full in all planes. Rotator cuff strength normal throughout. No signs of impingement with negative Neer and Hawkin's tests, empty can. Speeds and Yergason's tests normal. No labral pathology noted with negative Obrien's, negative crank, positive clunk, and good stability. Normal scapular function observed. No painful arc and no drop arm sign. Positive apprehension sign, positive relocation sign.  Impression and Recommendations:    Shoulder subluxation, left With both a positive apprehension sign positive relocation sign. X-rays, MR arthrogram. Rehab exercises given, instructed to avoid positions of abduction and external rotation. I also instructed him on how to relocate the shoulder himself should it dislocate completely.   ___________________________________________ Ihor Austinhomas J. Benjamin Stainhekkekandam, M.D., ABFM., CAQSM. Primary Care and Sports Medicine [facility]  Adjunct Professor of Family Medicine  [employer]

## 2018-11-18 ENCOUNTER — Ambulatory Visit (INDEPENDENT_AMBULATORY_CARE_PROVIDER_SITE_OTHER): Payer: Medicare Other

## 2018-11-18 ENCOUNTER — Encounter: Payer: Self-pay | Admitting: Sports Medicine

## 2018-11-18 ENCOUNTER — Ambulatory Visit (INDEPENDENT_AMBULATORY_CARE_PROVIDER_SITE_OTHER): Payer: Medicare Other | Admitting: Sports Medicine

## 2018-11-18 ENCOUNTER — Other Ambulatory Visit: Payer: Self-pay

## 2018-11-18 DIAGNOSIS — S43002A Unspecified subluxation of left shoulder joint, initial encounter: Secondary | ICD-10-CM

## 2018-11-18 DIAGNOSIS — S43002D Unspecified subluxation of left shoulder joint, subsequent encounter: Secondary | ICD-10-CM | POA: Diagnosis not present

## 2018-11-18 DIAGNOSIS — M25512 Pain in left shoulder: Secondary | ICD-10-CM | POA: Diagnosis not present

## 2018-11-18 DIAGNOSIS — S43432A Superior glenoid labrum lesion of left shoulder, initial encounter: Secondary | ICD-10-CM

## 2018-11-18 DIAGNOSIS — S43006A Unspecified dislocation of unspecified shoulder joint, initial encounter: Secondary | ICD-10-CM | POA: Diagnosis not present

## 2018-11-18 NOTE — Assessment & Plan Note (Signed)
Positive apprehension sign, relocation sign, injection for MR arthrogram today. Treatment will depend on results.

## 2018-11-18 NOTE — Progress Notes (Signed)
   Procedure: Real-time Ultrasound Guided gadolinium contrast injection of left glenohumeral joint Device: GE Logiq E  Verbal informed consent obtained.  Time-out conducted.  Noted no overlying erythema, induration, or other signs of local infection.  Skin prepped in a sterile fashion.  Local anesthesia: Topical Ethyl chloride.  With sterile technique and under real time ultrasound guidance: Using a 22-gauge spinal needle advanced into the glenohumeral joint, I injected 1 cc Kenalog 40, 2 cc lidocaine, 2 cc bupivacaine, syringe switched and 0.1 cc gadolinium injected into the joint, syringe again switched and 10 cc sterile saline used to distend the joint Joint visualized and capsule seen distending confirming intra-articular placement of contrast material and medication. Completed without difficulty  Advised to call if fevers/chills, erythema, induration, drainage, or persistent bleeding.  Images permanently stored and available for review in the ultrasound unit.  Impression: Technically successful ultrasound guided gadolinium contrast injection for MR arthrography.  Please see separate MR arthrogram report.

## 2018-11-21 ENCOUNTER — Other Ambulatory Visit: Payer: Self-pay

## 2018-11-21 ENCOUNTER — Encounter: Payer: Self-pay | Admitting: Physical Therapy

## 2018-11-21 ENCOUNTER — Ambulatory Visit (INDEPENDENT_AMBULATORY_CARE_PROVIDER_SITE_OTHER): Payer: Medicare Other | Admitting: Physical Therapy

## 2018-11-21 DIAGNOSIS — M25512 Pain in left shoulder: Secondary | ICD-10-CM | POA: Diagnosis not present

## 2018-11-21 DIAGNOSIS — M25612 Stiffness of left shoulder, not elsewhere classified: Secondary | ICD-10-CM

## 2018-11-21 NOTE — Therapy (Addendum)
[facility]  [place], [place], [address] Phone: [phone]   Fax:  [phone]  Physical Therapy Evaluation/Discharge Summary  Patient Details  Name: Trevor Watson MRN: [id number] Date of Birth: 10/08/1950 No data recorded  Encounter Date: 11/21/2018  PT End of Session - 11/21/18 1321    Visit Number  1    Number of Visits  4    Date for PT Re-Evaluation  12/20/18    PT Start Time  0934    PT Stop Time  1018    PT Time Calculation (min)  44 min    Activity Tolerance  Patient tolerated treatment well    Behavior During Therapy  [facility] for tasks assessed/performed       Past Medical History:  Diagnosis Date  . Abdominal aortic ectasia ([place]) 04/12/2018   [place] 03/2018, f/u 3 years   . Allergy   . Anxiety   . Arthritis   . Asthma   . Bronchitis    recurrent  . Complication of anesthesia   . GERD (gastroesophageal reflux disease)   . History of inguinal hernia repair   . Hypertension   . IBS (irritable bowel syndrome)   . PONV (postoperative nausea and vomiting)    AFTER HIP SURGERY     12/2015  . Recurrent boils   . Thyroid nodule    checked every year has not changed    Past Surgical History:  Procedure Laterality Date  . LAPAROSCOPIC CHOLECYSTECTOMY  1996  . left inguinal hernia repair  age 48  . right inguinl hernia repair w/ mesh  09/07   Dr. Grandville Silos  . TOTAL HIP ARTHROPLASTY Left 01/05/2016   Procedure: TOTAL HIP ARTHROPLASTY ANTERIOR APPROACH;  Surgeon: Frederik Pear, MD;  Location: [facility];  Service: Orthopedics;  Laterality: Left;    There were no vitals filed for this visit.   Subjective Assessment - 11/21/18 0944    Subjective  Pt arriving to therapy s/p L shoulder subluxation. Pt reported it happened about 3 months ago. Pt reporting he was standing buffing his RV on a ladder and the buffer jerked and pulled his arm. Pt reporting 95% of the time he doesn't have pain. Pt only reporting  pain with reaching over his back and toward his back pockets. Pt also having trouble sleeping.    Currently in Pain?  Yes    Pain Score  9    when initally reaching behind back   Pain Orientation  Left    Pain Descriptors / Indicators  Sharp;Burning    Pain Type  Acute pain    Pain Onset  More than a month ago    Pain Frequency  Intermittent    Aggravating Factors   reaching behind the back, sleeping is difficult when he rolls on the his left side    Pain Relieving Factors  resting, changing positions    Effect of Pain on Daily Activities  see above         [facility] PT Assessment - 11/21/18 0001      Assessment   Medical Diagnosis  S43.002D L shoulder subluxation    Onset Date/Surgical Date  08/21/18   approximately 3 months ago   Hand Dominance  Left    Prior Therapy  no      Precautions   Precautions  None      Restrictions   Weight Bearing Restrictions  No      Balance Screen   Has the  patient fallen in the past 6 months  No    Is the patient reluctant to leave their home because of a fear of falling?   No      Home Film/video editor residence    Living Arrangements  Spouse/significant other    Type of Home  Other(Comment)   RV   Home Access  Stairs to enter    Entrance Stairs-Number of Steps  4    Entrance Stairs-Rails  Left      Prior Function   Level of Independence  Independent      Cognition   Overall Cognitive Status  Within Functional Limits for tasks assessed      Observation/Other Assessments   Focus on Therapeutic Outcomes (FOTO)   Pt not able to attend but 2-3 follow up visits, FOTO differed      ROM / Strength   AROM / PROM / Strength  AROM;Strength      AROM   Overall AROM   Deficits    AROM Assessment Site  Shoulder    Right/Left Shoulder  Left    Left Shoulder Extension  60 Degrees    Left Shoulder Flexion  158 Degrees    Left Shoulder ABduction  120 Degrees    Left Shoulder External Rotation  50 Degrees    Left  Shoulder Horizontal ABduction  --   thumb to T11, with pain in anterior shoulder     Strength   Strength Assessment Site  Shoulder    Right/Left Shoulder  Right;Left      Palpation   Palpation comment  TTP anterior and lateral shoulder       Ambulation/Gait   Gait Comments  Pt with decreased arm swing on left compared to right                Objective measurements completed on examination: See above findings.              PT Education - 11/21/18 1320    Education Details  HEP, PT POC since pt will be leaving next week for 1 year tour around the [place] in his RV.    Person(s) Educated  Patient    Methods  Explanation;Demonstration;Handout    Comprehension  Verbalized understanding;Returned demonstration       PT Short Term Goals - 11/21/18 1328      PT SHORT TERM GOAL #1   Title  Pt will be independent in his HEP and progression.    Baseline  initial HEP issued on 11/21/2018    Time  1    Period  Weeks    Status  New    Target Date  11/28/18      PT SHORT TERM GOAL #2   Title  Pt will be able to demonstrate correct lifting techniques safely.    Time  1    Period  Weeks    Status  New    Target Date  11/28/18                Plan - 11/21/18 1322    Clinical Impression Statement  Pt presenting today for PT evaluation. Pt s/p left shoulder subluxation about 3 months ago. Pt reporting no pain at rest, but pain with IR and ER. Pt with limitations in AROM (see flowsheet). Skilled PT needed to address pt impairments and edu pt in HEP and progression before leaving for 1 year tour of the [place]. Pt instructed  to schedule at least 2-3 visits next week before leaving on his trip to work on ROM, strengthening, and shoulder stability.    Personal Factors and Comorbidities  Comorbidity 2;Comorbidity 3+    Comorbidities  HTN, arthritis, anxiety, GERD, IBS, L toatl hip arthroplasty    Examination-Activity Limitations  Sleep;Carry;Bathing     Examination-Participation Restrictions  Yard Work    Stability/Clinical Decision Making  Stable/Uncomplicated    Clinical Decision Making  Low    Rehab Potential  Good    PT Frequency  3x / week    PT Treatment/Interventions  Functional mobility training;Therapeutic activities;Therapeutic exercise;Manual techniques;Passive range of motion;Taping    PT Next Visit Plan  Continue to progress pt's HEPin shoulder strengthening, ROM, and stability,  in the next 2-3 visits prior to pt leaving on prolonged trip across the country    PT Home Exercise Plan  continue shoulder isometrics, see pt instructions    Consulted and Agree with Plan of Care  Patient       Patient will benefit from skilled therapeutic intervention in order to improve the following deficits and impairments:  Pain, Decreased strength, Decreased range of motion  Visit Diagnosis: 1. Acute pain of left shoulder   2. Stiffness of left shoulder, not elsewhere classified        Problem List Patient Active Problem List   Diagnosis Date Noted  . 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 Abdominal aortic ectasia ([place]) 04/12/2018  . Chronic kidney disease (CKD), stage III (moderate) ([place]) 02/20/2017  . Concern about skin disease without diagnosis 08/10/2016  . Pain of foot 08/10/2016  . Primary localized osteoarthritis of left hip 01/05/2016  . Airway hyperreactivity 03/16/2014  . Thyroid nodule 12/03/2013  . Hyperglycemia 12/03/2013  . Recurrent boils 12/03/2013  . Tremor 12/03/2013  . Degenerative joint disease of hand 12/03/2013  . Hypertension 10/11/2011  . Snoring 03/30/2011  . Asthmatic bronchitis 03/30/2011  . INGUINAL HERNIA 03/14/2009  . GERD 03/01/2009  . IRRITABLE BOWEL SYNDROME 03/01/2009  . Adaptive colitis 03/01/2009    Oretha Caprice, PT 11/21/2018, 1:32 PM  [facility] [place], [place], [address] Phone: [phone]   Fax:  [phone]  Name: Trevor Watson MRN: [id number] Date of Birth: 06-01-1950     PHYSICAL THERAPY DISCHARGE SUMMARY  Visits from Start of Care: 1  Current functional level related to goals / functional outcomes: See above   Remaining deficits: Unknown    Education / Equipment: HEP  Plan: Patient agrees to discharge.  Patient goals were not met. Patient is being discharged due to not returning since the last visit.  ?????     Laureen Abrahams, PT, DPT 01/06/19 12:57 PM  [facility] [place] [place], [place] [address]  [phone] (office) [phone] (fax)

## 2018-11-28 ENCOUNTER — Other Ambulatory Visit: Payer: Self-pay

## 2018-11-28 ENCOUNTER — Other Ambulatory Visit: Payer: Self-pay | Admitting: Osteopathic Medicine

## 2018-11-28 MED ORDER — HYDROCHLOROTHIAZIDE 25 MG PO TABS: 25.0000 mg | ORAL_TABLET | Freq: Every day | ORAL | 1 refills | Status: DC

## 2018-12-19 ENCOUNTER — Encounter: Payer: Self-pay | Admitting: Osteopathic Medicine

## 2018-12-19 MED ORDER — VALSARTAN 320 MG PO TABS: 320.0000 mg | ORAL_TABLET | Freq: Every day | ORAL | 0 refills | Status: DC

## 2019-01-23 DIAGNOSIS — Z23 Encounter for immunization: Secondary | ICD-10-CM | POA: Diagnosis not present

## 2019-02-19 ENCOUNTER — Encounter: Payer: Self-pay | Admitting: Osteopathic Medicine

## 2019-02-19 DIAGNOSIS — N529 Male erectile dysfunction, unspecified: Secondary | ICD-10-CM

## 2019-02-19 MED ORDER — SILDENAFIL CITRATE 20 MG PO TABS: ORAL_TABLET | ORAL | 0 refills | Status: DC

## 2019-03-18 ENCOUNTER — Encounter: Payer: Self-pay | Admitting: Osteopathic Medicine

## 2019-03-18 MED ORDER — VALSARTAN 320 MG PO TABS: 320.0000 mg | ORAL_TABLET | Freq: Every day | ORAL | 0 refills | Status: DC

## 2019-04-28 ENCOUNTER — Other Ambulatory Visit: Payer: Self-pay | Admitting: Osteopathic Medicine

## 2019-04-28 DIAGNOSIS — N529 Male erectile dysfunction, unspecified: Secondary | ICD-10-CM

## 2019-04-28 MED ORDER — SILDENAFIL CITRATE 20 MG PO TABS: ORAL_TABLET | ORAL | 0 refills | Status: DC

## 2019-05-29 ENCOUNTER — Encounter: Payer: Self-pay | Admitting: Osteopathic Medicine

## 2019-05-30 MED ORDER — HYDROCHLOROTHIAZIDE 25 MG PO TABS: 25.0000 mg | ORAL_TABLET | Freq: Every day | ORAL | 0 refills | Status: DC

## 2019-06-14 ENCOUNTER — Other Ambulatory Visit: Payer: Self-pay | Admitting: Osteopathic Medicine

## 2019-06-17 ENCOUNTER — Encounter: Payer: Self-pay | Admitting: Osteopathic Medicine

## 2019-06-17 MED ORDER — VALSARTAN 320 MG PO TABS: 320.0000 mg | ORAL_TABLET | Freq: Every day | ORAL | 0 refills | Status: DC

## 2019-06-25 ENCOUNTER — Telehealth (INDEPENDENT_AMBULATORY_CARE_PROVIDER_SITE_OTHER): Payer: Medicare Other | Admitting: Osteopathic Medicine

## 2019-06-25 ENCOUNTER — Encounter: Payer: Self-pay | Admitting: Osteopathic Medicine

## 2019-06-25 DIAGNOSIS — I10 Essential (primary) hypertension: Secondary | ICD-10-CM | POA: Diagnosis not present

## 2019-06-25 DIAGNOSIS — N529 Male erectile dysfunction, unspecified: Secondary | ICD-10-CM

## 2019-06-25 DIAGNOSIS — N183 Chronic kidney disease, stage 3 unspecified: Secondary | ICD-10-CM | POA: Diagnosis not present

## 2019-06-25 MED ORDER — SILDENAFIL CITRATE 20 MG PO TABS: 80.0000 mg | ORAL_TABLET | Freq: Every day | ORAL | 1 refills | Status: DC | PRN

## 2019-06-25 MED ORDER — HYDROCHLOROTHIAZIDE 25 MG PO TABS: 25.0000 mg | ORAL_TABLET | Freq: Every day | ORAL | 3 refills | Status: DC

## 2019-06-25 MED ORDER — VALSARTAN 320 MG PO TABS: 320.0000 mg | ORAL_TABLET | Freq: Every day | ORAL | 3 refills | Status: DC

## 2019-06-25 MED ORDER — OMEPRAZOLE MAGNESIUM 20 MG PO TBEC: 20.0000 mg | DELAYED_RELEASE_TABLET | Freq: Every day | ORAL | 3 refills | Status: AC

## 2019-06-25 NOTE — Progress Notes (Signed)
Virtual Visit via Phone  I connected with      Trevor Watson on 06/25/19 at 3:40 PM  by a telemedicine application and verified that I am speaking with the correct person using two identifiers.  Patient is at a campground in [place]! I am in office, much less fun    I discussed the limitations of evaluation and management by telemedicine and the availability of in person appointments. The patient expressed understanding and agreed to proceed.  History of Present Illness: Trevor Watson is a 69 y.o. male who would like to discuss med refills   Last labs 06/2018 CMP ok  Pt doing well Will let us know local pharmacy  Advised ideally we'd want lab work to ensure med safety, we risk complications if there are abnormliaites we don't know about but e feels fine so i'm not overly concerned.       Observations/Objective: There were no vitals taken for this visit. BP Readings from Last 3 Encounters:  11/08/18 134/75  06/26/18 (!) 143/73  04/05/18 138/77   Exam: Normal Speech.  NAD  Lab and Radiology Results No results found for this or any previous visit (from the past 72 hour(s)). No results found.     Assessment and Plan: 69 y.o. male with The primary encounter diagnosis was Essential hypertension. Diagnoses of Erectile dysfunction, unspecified erectile dysfunction type and Stage 3 chronic kidney disease, unspecified whether stage 3a or 3b CKD were also pertinent to this visit.   PDMP not reviewed this encounter. No orders of the defined types were placed in this encounter.  Meds ordered this encounter  Medications  . hydrochlorothiazide (HYDRODIURIL) 25 MG tablet    Sig: Take 1 tablet (25 mg total) by mouth daily.    Dispense:  90 tablet    Refill:  3  . omeprazole (PRILOSEC OTC) 20 MG tablet    Sig: Take 1 tablet (20 mg total) by mouth daily.    Dispense:  90 tablet    Refill:  3  . valsartan (DIOVAN) 320 MG tablet    Sig: Take 1 tablet (320 mg total)  by mouth daily.    Dispense:  90 tablet    Refill:  3  . sildenafil (REVATIO) 20 MG tablet    Sig: Take 4-5 tablets (80-100 mg total) by mouth daily as needed (prior to sex).    Dispense:  50 tablet    Refill:  1     Follow Up Instructions: Return for let us know when you're back in town - will get labs!.    I discussed the assessment and treatment plan with the patient. The patient was provided an opportunity to ask questions and all were answered. The patient agreed with the plan and demonstrated an understanding of the instructions.   The patient was advised to call back or seek an in-person evaluation if any new concerns, if symptoms worsen or if the condition fails to improve as anticipated.  30 minutes of non-face-to-face time was provided during this encounter.      . . . . . . . . . . . . . Marland Kitchen                   Historical information moved to improve visibility of documentation.  Past Medical History:  Diagnosis Date  . Abdominal aortic ectasia ([place]) 04/12/2018   [place] 03/2018, f/u 3 years   . Allergy   . Anxiety   . Arthritis   .  Asthma   . Bronchitis    recurrent  . Complication of anesthesia   . GERD (gastroesophageal reflux disease)   . History of inguinal hernia repair   . Hypertension   . IBS (irritable bowel syndrome)   . PONV (postoperative nausea and vomiting)    AFTER HIP SURGERY     12/2015  . Recurrent boils   . Thyroid nodule    checked every year has not changed   Past Surgical History:  Procedure Laterality Date  . LAPAROSCOPIC CHOLECYSTECTOMY  1996  . left inguinal hernia repair  age 69  . right inguinl hernia repair w/ mesh  09/07   Dr. Janee Morn  . TOTAL HIP ARTHROPLASTY Left 01/05/2016   Procedure: TOTAL HIP ARTHROPLASTY ANTERIOR APPROACH;  Surgeon: Gean Birchwood, MD;  Location: [facility];  Service: Orthopedics;  Laterality: Left;   Social History   Tobacco Use  . Smoking status: Former Smoker    Packs/day: 2.00     Years: 30.00    Pack years: 60.00    Types: Cigarettes    Quit date: 04/18/1983    Years since quitting: 36.2  . Smokeless tobacco: Never Used  Substance Use Topics  . Alcohol use: Yes    Alcohol/week: 6.0 standard drinks    Types: 6 Cans of beer per week    Comment: about 6 drinks per week   family history includes Breast cancer in his mother; Hypertension in his father; Parkinson's disease in his father; Thyroid cancer in his father.  Medications: Current Outpatient Medications  Medication Sig Dispense Refill  . albuterol (PROVENTIL HFA;VENTOLIN HFA) 108 (90 Base) MCG/ACT inhaler Inhale 2 puffs into the lungs every 6 (six) hours as needed for wheezing or shortness of breath. 2 Inhaler 3  . hydrochlorothiazide (HYDRODIURIL) 25 MG tablet Take 1 tablet (25 mg total) by mouth daily. 90 tablet 3  . omeprazole (PRILOSEC OTC) 20 MG tablet Take 1 tablet (20 mg total) by mouth daily. 90 tablet 3  . sildenafil (REVATIO) 20 MG tablet Take 4-5 tablets (80-100 mg total) by mouth daily as needed (prior to sex). 50 tablet 1  . valsartan (DIOVAN) 320 MG tablet Take 1 tablet (320 mg total) by mouth daily. 90 tablet 3  . HYDROcodone-acetaminophen (NORCO/VICODIN) 5-325 MG tablet Take by mouth.     No current facility-administered medications for this visit.   Allergies  Allergen Reactions  . No Known Allergies

## 2019-06-25 NOTE — Progress Notes (Signed)
Pt states he's at a campground in [place] that has "spotty" internet service. He will try to connect.  Requesting a direct phone call, if not.

## 2019-06-29 ENCOUNTER — Encounter: Payer: Self-pay | Admitting: Osteopathic Medicine

## 2019-06-30 ENCOUNTER — Other Ambulatory Visit: Payer: Self-pay

## 2019-06-30 MED ORDER — HYDROCHLOROTHIAZIDE 25 MG PO TABS: 25.0000 mg | ORAL_TABLET | Freq: Every day | ORAL | 0 refills | Status: DC

## 2019-07-01 ENCOUNTER — Other Ambulatory Visit: Payer: Self-pay

## 2019-07-01 MED ORDER — HYDROCHLOROTHIAZIDE 25 MG PO TABS: 25.0000 mg | ORAL_TABLET | Freq: Every day | ORAL | 0 refills | Status: DC

## 2019-07-02 ENCOUNTER — Other Ambulatory Visit: Payer: Self-pay

## 2019-07-02 MED ORDER — HYDROCHLOROTHIAZIDE 25 MG PO TABS: 25.0000 mg | ORAL_TABLET | Freq: Every day | ORAL | 0 refills | Status: DC

## 2019-08-14 ENCOUNTER — Other Ambulatory Visit: Payer: Self-pay

## 2019-08-14 ENCOUNTER — Encounter: Payer: Self-pay | Admitting: Osteopathic Medicine

## 2019-08-14 DIAGNOSIS — N529 Male erectile dysfunction, unspecified: Secondary | ICD-10-CM

## 2019-08-14 MED ORDER — SILDENAFIL CITRATE 20 MG PO TABS: 80.0000 mg | ORAL_TABLET | Freq: Every day | ORAL | 1 refills | Status: DC | PRN

## 2019-08-15 ENCOUNTER — Other Ambulatory Visit: Payer: Self-pay

## 2019-08-15 DIAGNOSIS — N529 Male erectile dysfunction, unspecified: Secondary | ICD-10-CM

## 2019-08-15 MED ORDER — SILDENAFIL CITRATE 20 MG PO TABS: 80.0000 mg | ORAL_TABLET | Freq: Every day | ORAL | 0 refills | Status: DC | PRN

## 2019-08-15 NOTE — Addendum Note (Signed)
Addended by: Jed Limerick on: 08/15/2019 11:35 AM   Modules accepted: Orders

## 2019-09-10 ENCOUNTER — Other Ambulatory Visit: Payer: Self-pay

## 2019-09-10 ENCOUNTER — Encounter: Payer: Self-pay | Admitting: Osteopathic Medicine

## 2019-09-10 MED ORDER — VALSARTAN 320 MG PO TABS: 320.0000 mg | ORAL_TABLET | Freq: Every day | ORAL | 0 refills | Status: DC

## 2019-09-12 ENCOUNTER — Other Ambulatory Visit: Payer: Self-pay

## 2019-09-12 MED ORDER — VALSARTAN 320 MG PO TABS: 320.0000 mg | ORAL_TABLET | Freq: Every day | ORAL | 0 refills | Status: DC

## 2019-09-24 ENCOUNTER — Other Ambulatory Visit: Payer: Self-pay | Admitting: Osteopathic Medicine

## 2019-09-26 MED ORDER — HYDROCHLOROTHIAZIDE 25 MG PO TABS: 25.0000 mg | ORAL_TABLET | Freq: Every day | ORAL | 0 refills | Status: DC

## 2019-10-20 ENCOUNTER — Encounter: Payer: Self-pay | Admitting: Osteopathic Medicine

## 2019-10-21 ENCOUNTER — Other Ambulatory Visit: Payer: Self-pay

## 2019-10-21 DIAGNOSIS — N529 Male erectile dysfunction, unspecified: Secondary | ICD-10-CM

## 2019-10-21 MED ORDER — SILDENAFIL CITRATE 20 MG PO TABS: 80.0000 mg | ORAL_TABLET | Freq: Every day | ORAL | 0 refills | Status: DC | PRN

## 2019-10-21 NOTE — Telephone Encounter (Signed)
Refill for sildenafil sent to [facility] in [place]. Pt is requesting a dose decrease for Valsartan rx from 320 mg to 160 mg. Pls advise, thanks.

## 2019-10-22 MED ORDER — VALSARTAN 160 MG PO TABS: 160.0000 mg | ORAL_TABLET | Freq: Every day | ORAL | 0 refills | Status: DC

## 2019-10-27 ENCOUNTER — Telehealth: Payer: Self-pay | Admitting: Osteopathic Medicine

## 2019-10-27 NOTE — Telephone Encounter (Signed)
Received fax form for PA on Sildenafil filled out placed in providers box for signature will fax when signed - CF

## 2019-11-13 NOTE — Telephone Encounter (Signed)
Received a fax from insurance that Sildenafil is not covered under this plan. Do you have any other recommendations? Please advise.

## 2019-11-13 NOTE — Telephone Encounter (Signed)
Cialis (tadalafil) ?

## 2019-11-14 NOTE — Telephone Encounter (Signed)
Mychart message sent to patient.

## 2019-12-12 ENCOUNTER — Encounter: Payer: Self-pay | Admitting: Osteopathic Medicine

## 2019-12-12 NOTE — Telephone Encounter (Signed)
Does he need virtual appt? Last seen in March.

## 2019-12-15 MED ORDER — PREDNISONE 20 MG PO TABS: 20.0000 mg | ORAL_TABLET | Freq: Two times a day (BID) | ORAL | 0 refills | Status: AC

## 2019-12-15 MED ORDER — ALBUTEROL SULFATE HFA 108 (90 BASE) MCG/ACT IN AERS: 2.0000 | INHALATION_SPRAY | Freq: Four times a day (QID) | RESPIRATORY_TRACT | 0 refills | Status: AC | PRN

## 2020-01-06 ENCOUNTER — Encounter: Payer: Self-pay | Admitting: Osteopathic Medicine

## 2020-01-06 DIAGNOSIS — I10 Essential (primary) hypertension: Secondary | ICD-10-CM

## 2020-01-06 DIAGNOSIS — N529 Male erectile dysfunction, unspecified: Secondary | ICD-10-CM

## 2020-01-07 MED ORDER — SILDENAFIL CITRATE 20 MG PO TABS: 80.0000 mg | ORAL_TABLET | Freq: Every day | ORAL | 0 refills | Status: DC | PRN

## 2020-01-07 MED ORDER — HYDROCHLOROTHIAZIDE 25 MG PO TABS: 25.0000 mg | ORAL_TABLET | Freq: Every day | ORAL | 3 refills | Status: DC

## 2020-01-07 NOTE — Telephone Encounter (Signed)
Last HCTZ refill- 09/26/2019 Last Seildenfil refill- 10/21/2019   Last Ov- 06/25/2019 The patient is in [place] and would like these medications should be sent to [facility] in [place]

## 2020-01-09 ENCOUNTER — Telehealth: Payer: Self-pay | Admitting: Osteopathic Medicine

## 2020-01-09 NOTE — Telephone Encounter (Signed)
Received fax for PA on Sildenafil Citrate sent through cover my meds waiting on determination. - CF

## 2020-01-12 NOTE — Telephone Encounter (Signed)
Received fax from [employer] and they denied coverage on Sildenafil Citrate I am placing in providers box for review. - CF

## 2020-04-13 ENCOUNTER — Encounter: Payer: Self-pay | Admitting: Osteopathic Medicine

## 2020-05-05 IMAGING — MR MRI OF THE LEFT SHOULDER WITH CONTRAST
5 series · 40 of 40 positions shown · IV contrast (agent unspecified)
Comparison: None.

CLINICAL DATA: Dislocation, pain for 3 months

EXAM:
MR ARTHROGRAM OF THE left SHOULDER
TECHNIQUE: Multiplanar, multisequence MR imaging of the left shoulder was
performed following the administration of intra-articular contrast.
CONTRAST:  See Injection Documentation.

[Series 6: T1 fat-sat · axial · 4.0mm · 0.47mm/px · z∈[-54,+45]mm · 10 of 26 slices shown (1 of 3)]
[im 1/26]
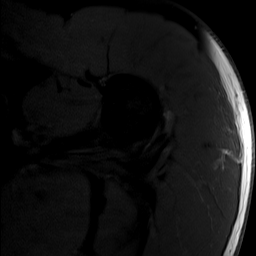
[im 3/26]
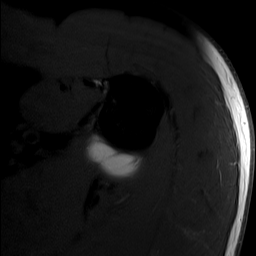
[im 6/26]
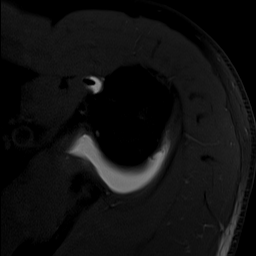
[im 9/26]
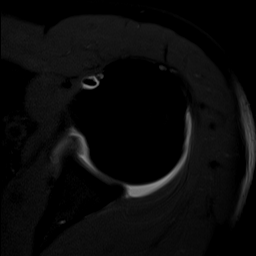
[im 12/26]
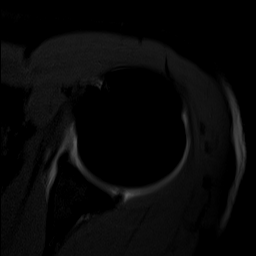
[im 14/26]
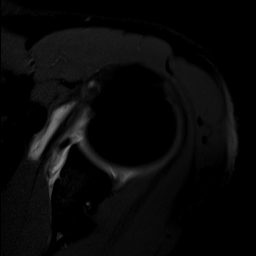
[im 17/26]
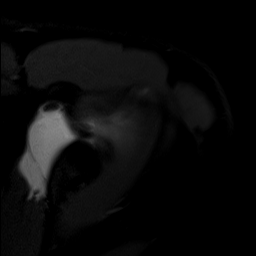
[im 20/26]
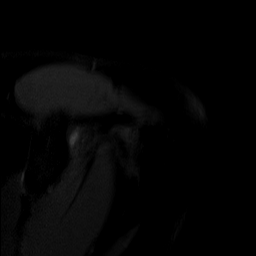
[im 23/26]
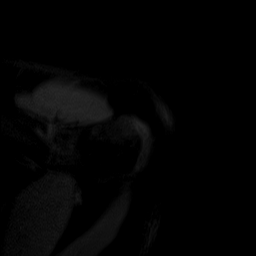
[im 26/26]
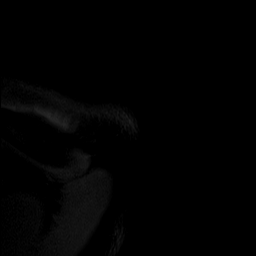

[Series 7: T1 fat-sat · oblique · 4.0mm · 0.55mm/px · 7 of 20 slices shown (2 of 3)]
[im 1/20]
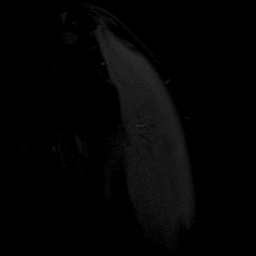
[im 4/20]
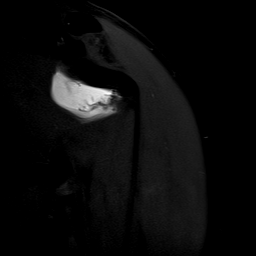
[im 7/20]
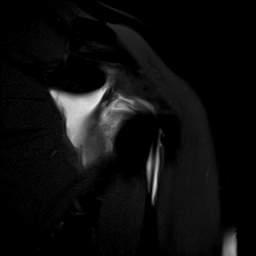
[im 10/20]
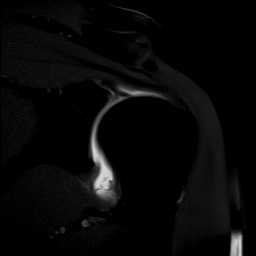
[im 13/20]
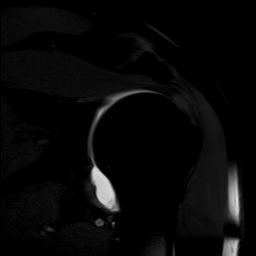
[im 16/20]
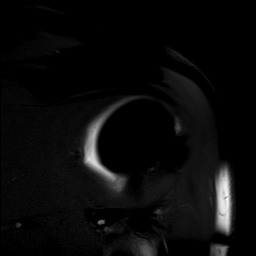
[im 20/20]
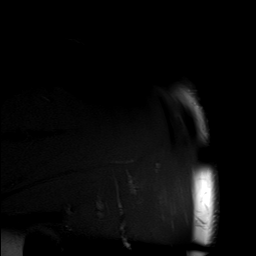

[Series 8: T2 fat-sat · oblique · 4.0mm · 0.55mm/px · 7 of 20 slices shown (1 of 2)]
[im 1/20]
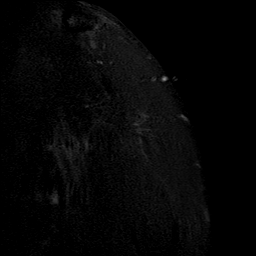
[im 4/20]
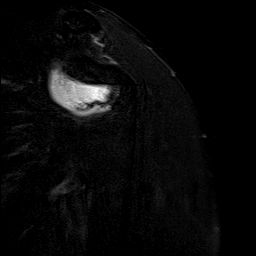
[im 7/20]
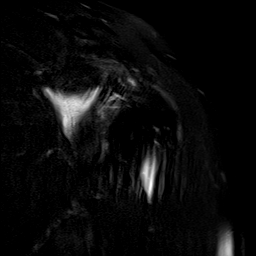
[im 10/20]
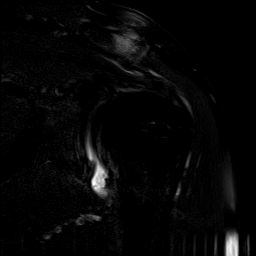
[im 13/20]
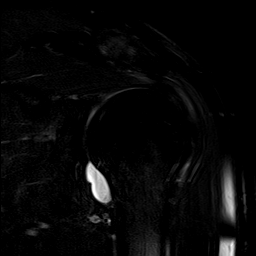
[im 16/20]
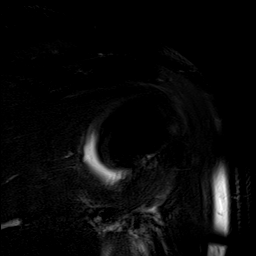
[im 20/20]
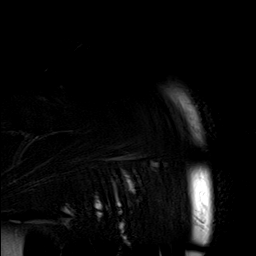

[Series 9: T1 fat-sat · oblique · non-contrast · 4.0mm · 0.44mm/px · 7 of 20 slices shown (3 of 3)]
[im 1/20]
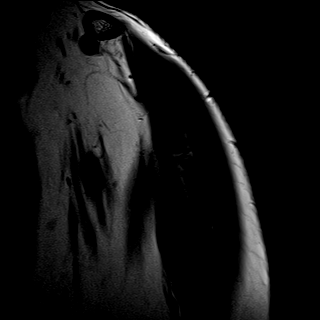
[im 4/20]
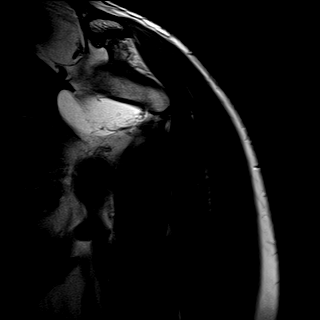
[im 7/20]
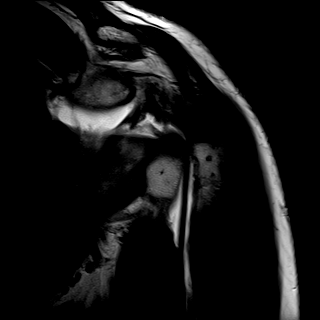
[im 10/20]
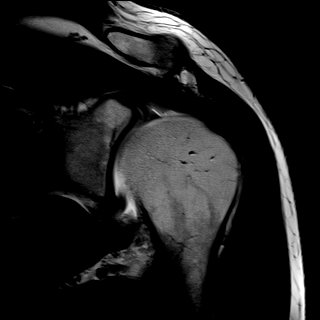
[im 13/20]
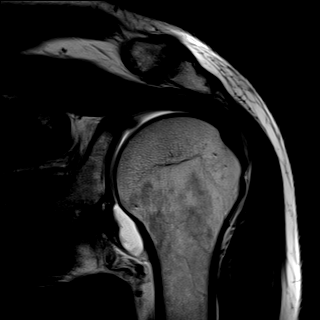
[im 16/20]
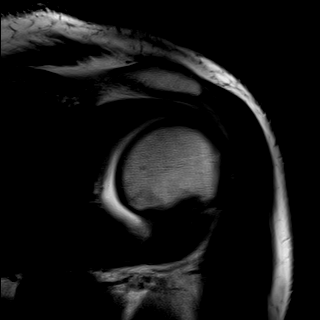
[im 20/20]
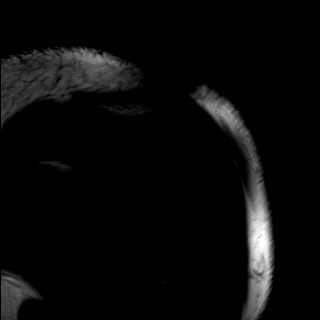

[Series 10: T2 fat-sat · oblique · 4.0mm · 0.55mm/px · 9 of 24 slices shown (2 of 2)]
[im 1/24]
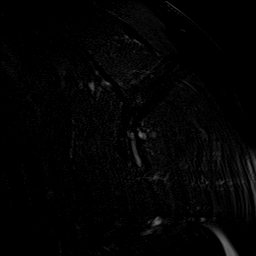
[im 3/24]
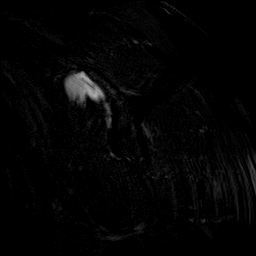
[im 6/24]
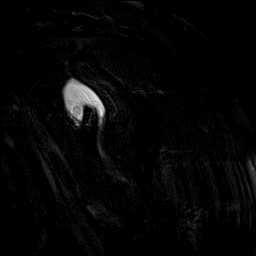
[im 9/24]
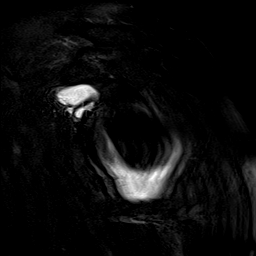
[im 12/24]
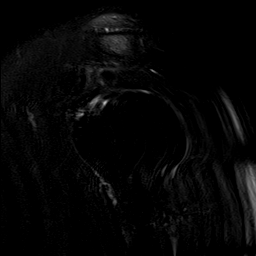
[im 15/24]
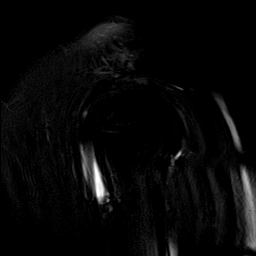
[im 18/24]
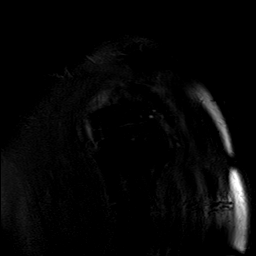
[im 21/24]
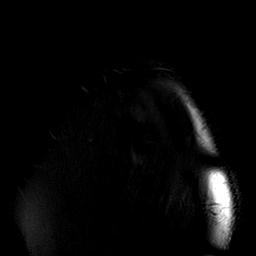
[im 24/24]
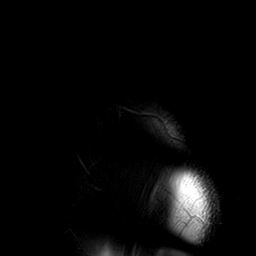

[40 of 40 positions shown; findings below may reference images not displayed]

FINDINGS: The study is somewhat limited due to patient motion.

Rotator cuff: there is increased signal and thickening seen in the
supraspinatus and the infraspinatus tendons. There is also increased
intrasubstance signal seen the subscapularis tendon. The teres minor
tendon is intact.

Muscles: The muscles other than the rotator cuff are normal without
tear, edema, or atrophy.

Biceps Long Head: The Intraarticular and extraarticular portions of
the biceps tendon are normal in position, size and signal.

Acromioclavicular Joint: AC joint arthrosis is seen with joint space
loss and capsular hypertrophy. Surrounding soft tissue edema seen.

Glenohumeral Joint: The glenohumeral joint alignment is well
maintained. The humeral head and glenoid articular cartilage are
without focal defect or significant thinning. There is adequate
distention of contrast in the glenohumeral joint.

Labrum: There is a nondisplaced tear seen at the superior labrum
extending from anterior to the posterior labrum. It extends from the
11 o'clock to the 1 o'clock position. Or no displaced labral
fragments are somewhat within the moderate space point

Bones: No fracture, osteonecrosis, or pathologic marrow
infiltration. Calcific changes are seen at the lateral corner of the
humeral head. Fracture the right.

Other: A small amount of fluid is over subacromial arch subdeltoid
bursa.
IMPRESSION: 1. The supraspinatus, infraspinatus, and subscapularis tendinosis.
No rotator cuff tears.
2. Superior labrum tear extending from the with 11 o'clock to the 1
o'clock position.
3. Moderate AC joint arthrosis and minimal subacromial-subdeltoid
bursitis.

## 2020-05-13 ENCOUNTER — Other Ambulatory Visit: Payer: Self-pay | Admitting: Osteopathic Medicine

## 2020-05-13 DIAGNOSIS — N529 Male erectile dysfunction, unspecified: Secondary | ICD-10-CM

## 2020-05-13 MED ORDER — SILDENAFIL CITRATE 20 MG PO TABS: 80.0000 mg | ORAL_TABLET | Freq: Every day | ORAL | 0 refills | Status: DC | PRN

## 2020-07-18 ENCOUNTER — Encounter: Payer: Self-pay | Admitting: Osteopathic Medicine

## 2020-07-19 MED ORDER — VALSARTAN 80 MG PO TABS: 80.0000 mg | ORAL_TABLET | Freq: Every day | ORAL | 3 refills | Status: AC

## 2020-10-08 ENCOUNTER — Other Ambulatory Visit: Payer: Self-pay | Admitting: Osteopathic Medicine

## 2020-10-08 DIAGNOSIS — N529 Male erectile dysfunction, unspecified: Secondary | ICD-10-CM

## 2021-01-05 ENCOUNTER — Other Ambulatory Visit: Payer: Self-pay | Admitting: Osteopathic Medicine

## 2021-01-05 DIAGNOSIS — I10 Essential (primary) hypertension: Secondary | ICD-10-CM

## 2021-01-07 ENCOUNTER — Other Ambulatory Visit: Payer: Self-pay | Admitting: Osteopathic Medicine

## 2021-01-07 DIAGNOSIS — I10 Essential (primary) hypertension: Secondary | ICD-10-CM

## 2021-02-07 ENCOUNTER — Other Ambulatory Visit: Payer: Self-pay | Admitting: Osteopathic Medicine

## 2021-02-07 DIAGNOSIS — I10 Essential (primary) hypertension: Secondary | ICD-10-CM
